# Patient Record
Sex: Female | Born: 1980 | Race: White | Hispanic: Yes | Marital: Single | Smoking: Never smoker
Health system: Southern US, Community
[De-identification: ages and names within clinical notes are randomized; demographics above are authoritative.]

## PROBLEM LIST (undated history)

## (undated) ENCOUNTER — Inpatient Hospital Stay (HOSPITAL_COMMUNITY): Payer: Self-pay

## (undated) DIAGNOSIS — Z87440 Personal history of urinary (tract) infections: Secondary | ICD-10-CM

## (undated) DIAGNOSIS — D219 Benign neoplasm of connective and other soft tissue, unspecified: Secondary | ICD-10-CM

## (undated) HISTORY — PX: CHOLECYSTECTOMY: SHX55

## (undated) HISTORY — DX: Personal history of urinary (tract) infections: Z87.440

## (undated) HISTORY — DX: Benign neoplasm of connective and other soft tissue, unspecified: D21.9

---

## 2003-07-22 ENCOUNTER — Encounter: Admission: RE | Admit: 2003-07-22 | Discharge: 2003-07-22 | Payer: Self-pay | Admitting: *Deleted

## 2003-07-29 ENCOUNTER — Encounter: Admission: RE | Admit: 2003-07-29 | Discharge: 2003-07-29 | Payer: Self-pay | Admitting: *Deleted

## 2003-08-02 ENCOUNTER — Inpatient Hospital Stay (HOSPITAL_COMMUNITY): Admission: AD | Admit: 2003-08-02 | Discharge: 2003-08-02 | Payer: Self-pay | Admitting: *Deleted

## 2003-08-03 ENCOUNTER — Inpatient Hospital Stay (HOSPITAL_COMMUNITY): Admission: AD | Admit: 2003-08-03 | Discharge: 2003-08-05 | Payer: Self-pay | Admitting: Family Medicine

## 2008-08-04 ENCOUNTER — Ambulatory Visit (HOSPITAL_COMMUNITY): Admission: RE | Admit: 2008-08-04 | Discharge: 2008-08-04 | Payer: Self-pay | Admitting: Obstetrics & Gynecology

## 2008-08-26 ENCOUNTER — Inpatient Hospital Stay (HOSPITAL_COMMUNITY): Admission: AD | Admit: 2008-08-26 | Discharge: 2008-08-28 | Payer: Self-pay | Admitting: Obstetrics & Gynecology

## 2008-08-26 ENCOUNTER — Ambulatory Visit: Payer: Self-pay | Admitting: Obstetrics & Gynecology

## 2008-09-25 ENCOUNTER — Inpatient Hospital Stay (HOSPITAL_COMMUNITY): Admission: AD | Admit: 2008-09-25 | Discharge: 2008-09-25 | Payer: Self-pay | Admitting: Obstetrics and Gynecology

## 2008-10-03 ENCOUNTER — Emergency Department (HOSPITAL_COMMUNITY): Admission: EM | Admit: 2008-10-03 | Discharge: 2008-10-03 | Payer: Self-pay | Admitting: Emergency Medicine

## 2008-10-17 ENCOUNTER — Encounter (INDEPENDENT_AMBULATORY_CARE_PROVIDER_SITE_OTHER): Payer: Self-pay | Admitting: Surgery

## 2008-10-17 ENCOUNTER — Ambulatory Visit (HOSPITAL_COMMUNITY): Admission: RE | Admit: 2008-10-17 | Discharge: 2008-10-18 | Payer: Self-pay | Admitting: Surgery

## 2010-05-14 LAB — CBC
HCT: 38.7 % (ref 36.0–46.0)
Hemoglobin: 13.1 g/dL (ref 12.0–15.0)
RBC: 4.42 MIL/uL (ref 3.87–5.11)
RDW: 13 % (ref 11.5–15.5)
WBC: 6.1 10*3/uL (ref 4.0–10.5)

## 2010-05-15 LAB — DIFFERENTIAL
Basophils Absolute: 0 K/uL (ref 0.0–0.1)
Basophils Absolute: 0 10*3/uL (ref 0.0–0.1)
Basophils Relative: 0 % (ref 0–1)
Eosinophils Absolute: 0.1 K/uL (ref 0.0–0.7)
Eosinophils Relative: 1 % (ref 0–5)
Lymphocytes Relative: 27 % (ref 12–46)
Lymphocytes Relative: 43 % (ref 12–46)
Lymphs Abs: 1.6 K/uL (ref 0.7–4.0)
Lymphs Abs: 2.9 10*3/uL (ref 0.7–4.0)
Monocytes Absolute: 0.3 K/uL (ref 0.1–1.0)
Monocytes Absolute: 0.4 10*3/uL (ref 0.1–1.0)
Monocytes Relative: 5 % (ref 3–12)
Monocytes Relative: 6 % (ref 3–12)
Neutro Abs: 4.1 K/uL (ref 1.7–7.7)
Neutro Abs: 3.3 10*3/uL (ref 1.7–7.7)
Neutrophils Relative %: 67 % (ref 43–77)

## 2010-05-15 LAB — GC/CHLAMYDIA PROBE AMP, GENITAL: Chlamydia, DNA Probe: NEGATIVE

## 2010-05-15 LAB — CBC
HCT: 36.3 % (ref 36.0–46.0)
HCT: 34.9 % — ABNORMAL LOW (ref 36.0–46.0)
Hemoglobin: 12.3 g/dL (ref 12.0–15.0)
Hemoglobin: 11.9 g/dL — ABNORMAL LOW (ref 12.0–15.0)
MCHC: 34 g/dL (ref 30.0–36.0)
MCV: 89.5 fL (ref 78.0–100.0)
MCV: 87.8 fL (ref 78.0–100.0)
Platelets: 288 K/uL (ref 150–400)
RBC: 3.98 MIL/uL (ref 3.87–5.11)
RDW: 13.2 % (ref 11.5–15.5)
RDW: 12.9 % (ref 11.5–15.5)
WBC: 6.8 K/uL (ref 4.0–10.5)

## 2010-05-15 LAB — URINALYSIS, ROUTINE W REFLEX MICROSCOPIC
Glucose, UA: NEGATIVE mg/dL
Specific Gravity, Urine: 1.01 (ref 1.005–1.030)
Urobilinogen, UA: 0.2 mg/dL (ref 0.0–1.0)
pH: 5.5 (ref 5.0–8.0)

## 2010-05-15 LAB — URINE MICROSCOPIC-ADD ON

## 2010-05-15 LAB — WET PREP, GENITAL: Trich, Wet Prep: NONE SEEN

## 2010-05-15 LAB — COMPREHENSIVE METABOLIC PANEL
Albumin: 3.8 g/dL (ref 3.5–5.2)
BUN: 14 mg/dL (ref 6–23)
Calcium: 9.2 mg/dL (ref 8.4–10.5)
Creatinine, Ser: 0.5 mg/dL (ref 0.4–1.2)
Total Protein: 6.9 g/dL (ref 6.0–8.3)

## 2010-05-15 LAB — LIPASE, BLOOD: Lipase: 49 U/L (ref 11–59)

## 2010-05-16 LAB — CBC
HCT: 28 % — ABNORMAL LOW (ref 36.0–46.0)
HCT: 29.8 % — ABNORMAL LOW (ref 36.0–46.0)
Hemoglobin: 12.9 g/dL (ref 12.0–15.0)
MCHC: 35 g/dL (ref 30.0–36.0)
MCV: 90.9 fL (ref 78.0–100.0)
MCV: 91.7 fL (ref 78.0–100.0)
Platelets: 198 10*3/uL (ref 150–400)
Platelets: 215 10*3/uL (ref 150–400)
RBC: 4.03 MIL/uL (ref 3.87–5.11)
RDW: 13.3 % (ref 11.5–15.5)
WBC: 13.1 10*3/uL — ABNORMAL HIGH (ref 4.0–10.5)

## 2010-05-16 LAB — DIFFERENTIAL
Blasts: 0 %
Metamyelocytes Relative: 0 %
Monocytes Absolute: 0.3 10*3/uL (ref 0.1–1.0)
Monocytes Relative: 3 % (ref 3–12)
Myelocytes: 0 %
Promyelocytes Absolute: 0 %
nRBC: 0 /100 WBC

## 2011-01-08 IMAGING — US US ABDOMEN COMPLETE
1 series · 14 of 25 positions shown · non-contrast
Comparison: None

CLINICAL DATA: Right upper quadrant pain.  35 weeks pregnant.

ABDOMINAL ULTRASOUND COMPLETE

[Series 1: us ob comp +14 wk · 14 of 73 slices shown]
[im 1/73]
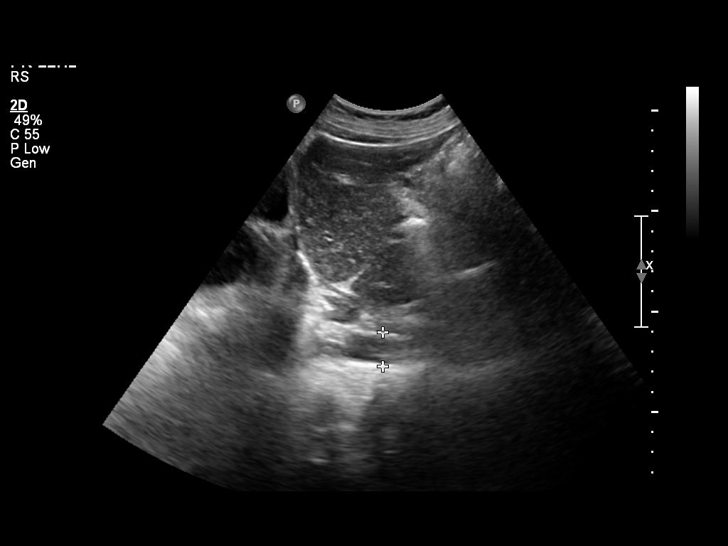
[im 7/73]
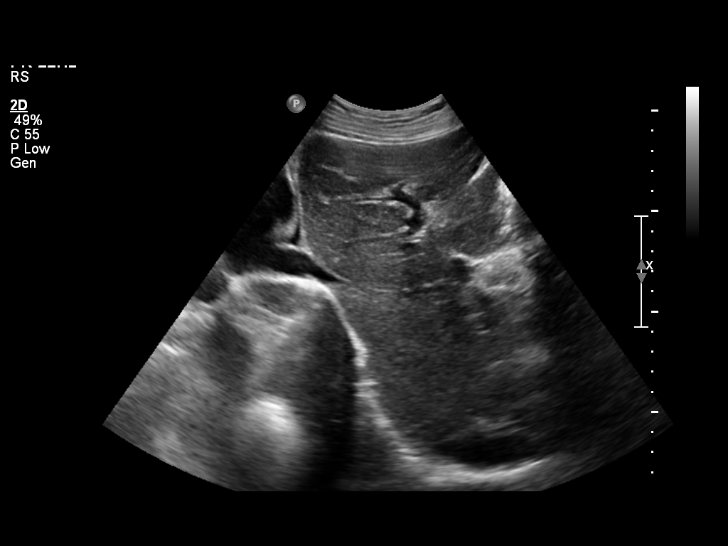
[im 13/73]
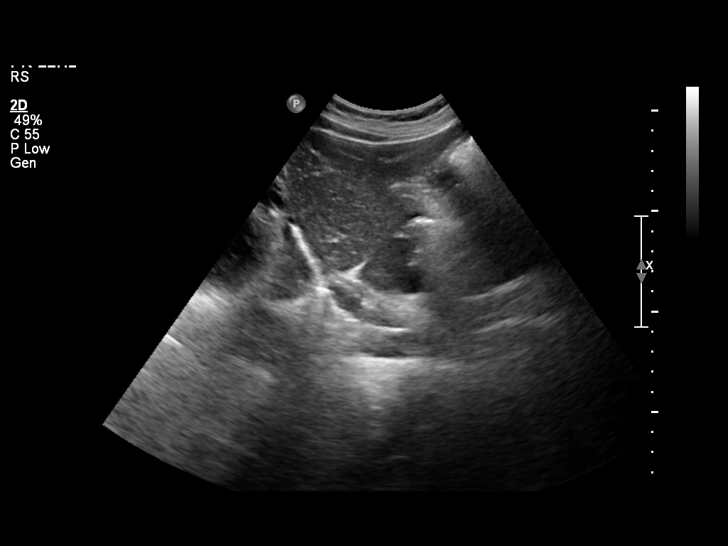
[im 19/73]
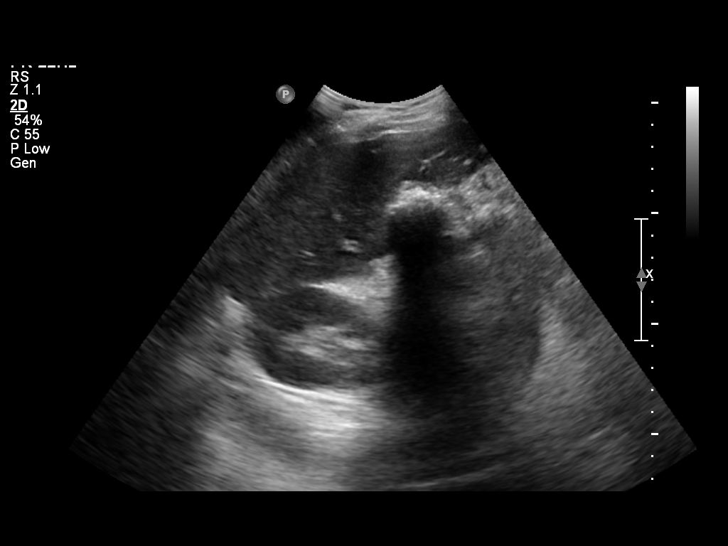
[im 25/73]
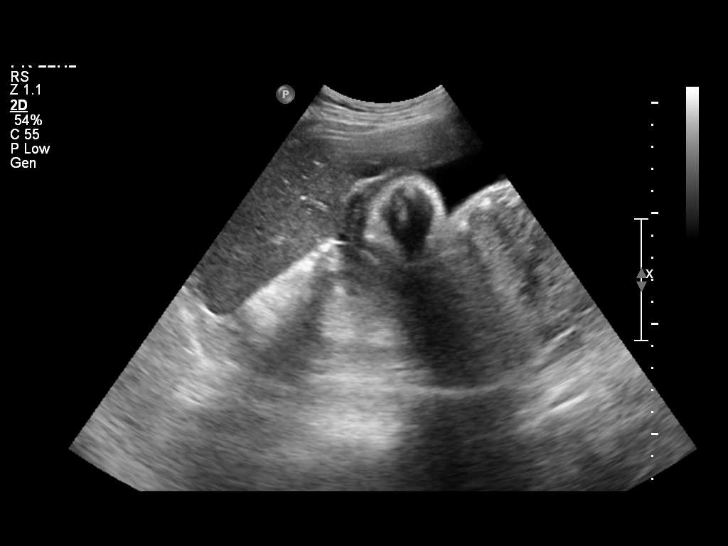
[im 28/73]
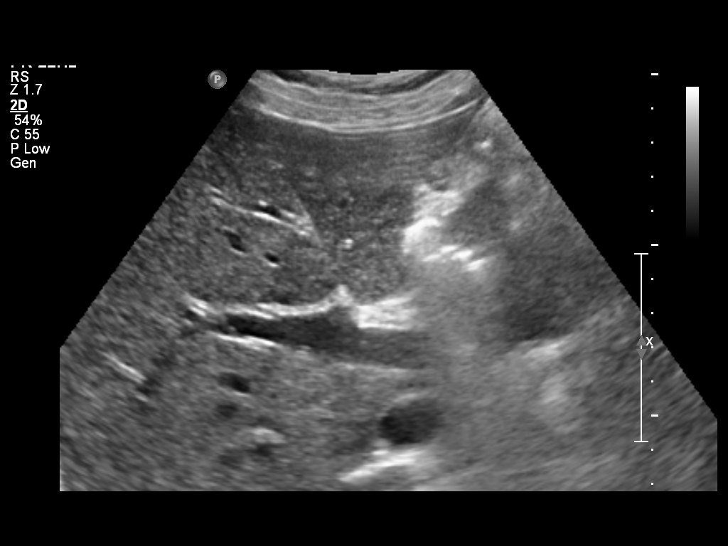
[im 34/73]
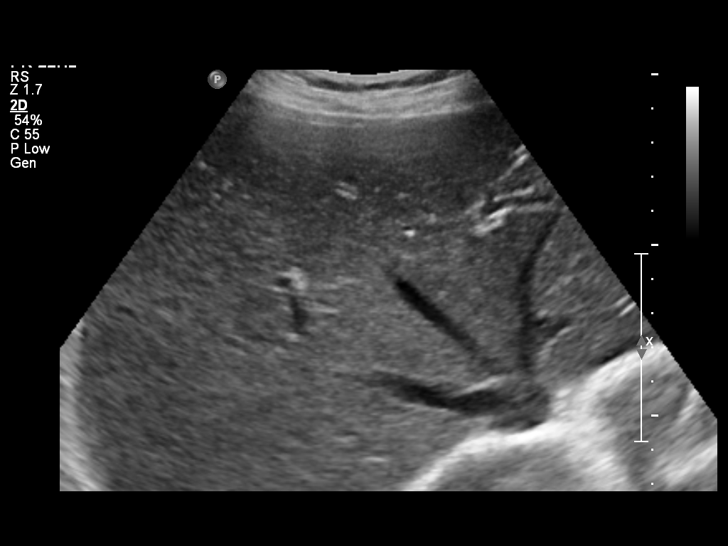
[im 40/73]
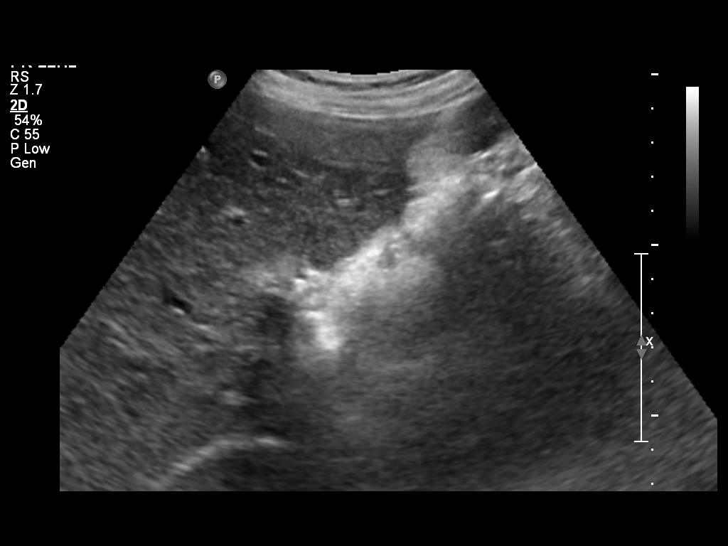
[im 46/73]
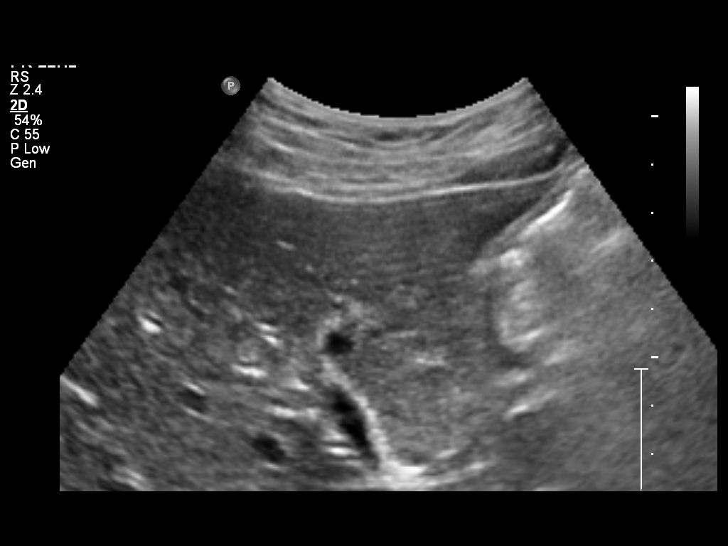
[im 49/73]
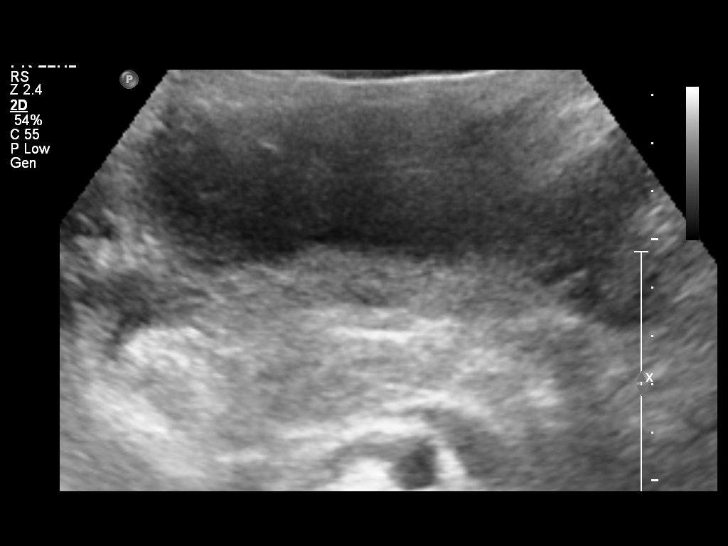
[im 55/73]
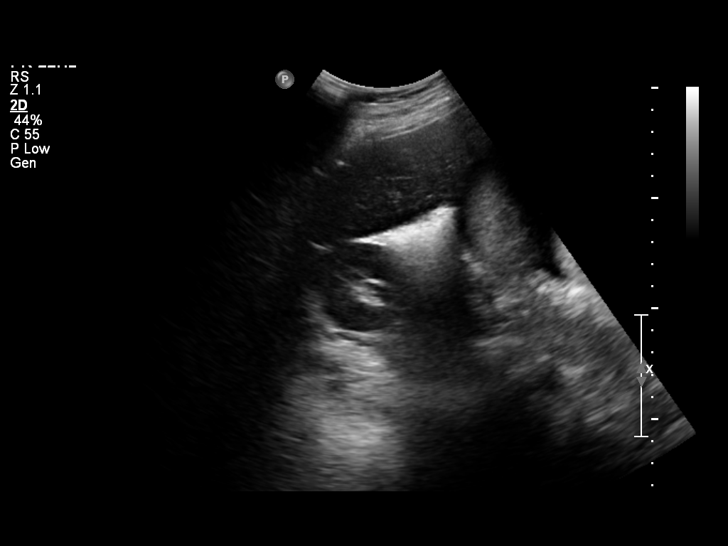
[im 61/73]
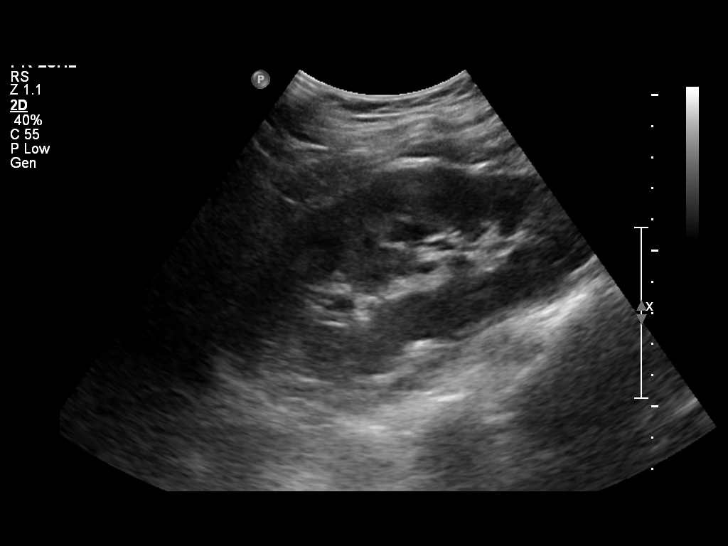
[im 67/73]
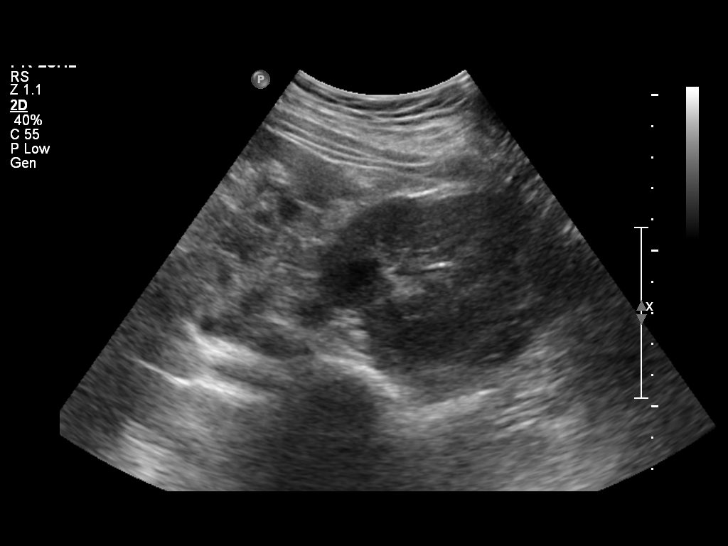
[im 73/73]
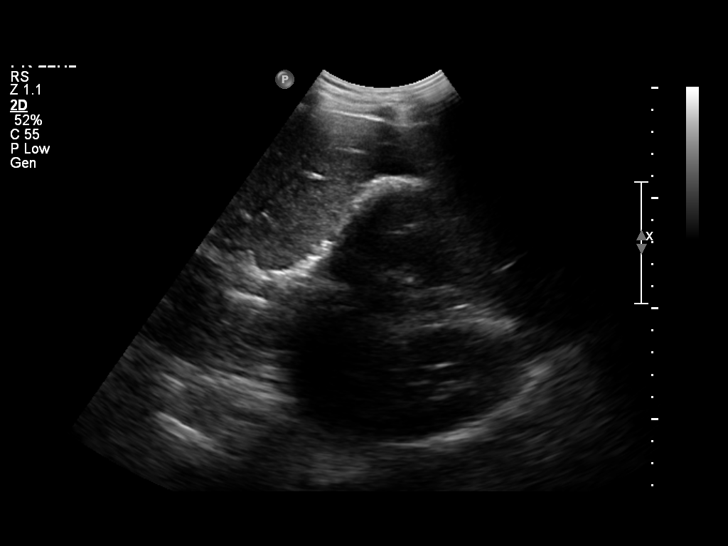

[14 of 25 positions shown; findings below may reference images not displayed]

FINDINGS: Gallbladder:  Wall echo shadow complex is seen, consistent with
gallstones filling the gallbladder lumen.  This limits the
visualization of the gallbladder, however there is no definite
evidence of gallbladder wall thickening or pericholecystic fluid.

Common Bile Duct:  Within normal limits in caliber.  Measures 2 mm
in diameter.

Liver:  No focal lesion identified.  Within normal limits in
parenchymal echogenicity.

IVC:  Appears normal.

Pancreas:  Although the pancreas is difficult to visualize in its
entirety, no focal pancreatic abnormality is identified.

Spleen:  Within normal limits in size and echotexture.

Right kidney: Normal in size and parenchymal echogenicity.  No
evidence of mass or hydronephrosis.

Left kidney: Normal in size and parenchymal echogenicity.  No
evidence of mass or hydronephrosis.

Abdominal Aorta:  No aneurysm identified.
IMPRESSION: Numerous gallstones filling the gallbladder.  No definite evidence
of acute cholecystitis or biliary dilatation.

## 2020-06-01 LAB — OB RESULTS CONSOLE VARICELLA ZOSTER ANTIBODY, IGG: Varicella: IMMUNE

## 2020-06-01 LAB — OB RESULTS CONSOLE ABO/RH: RH Type: POSITIVE

## 2020-06-01 LAB — OB RESULTS CONSOLE GC/CHLAMYDIA
Chlamydia: NEGATIVE
Gonorrhea: NEGATIVE

## 2020-06-01 LAB — OB RESULTS CONSOLE RUBELLA ANTIBODY, IGM: Rubella: IMMUNE

## 2020-06-01 LAB — OB RESULTS CONSOLE HIV ANTIBODY (ROUTINE TESTING): HIV: NONREACTIVE

## 2020-06-01 LAB — OB RESULTS CONSOLE RPR: RPR: NONREACTIVE

## 2020-06-01 LAB — HEPATITIS C ANTIBODY: HCV Ab: NEGATIVE

## 2020-06-03 ENCOUNTER — Other Ambulatory Visit: Payer: Self-pay | Admitting: Nurse Practitioner

## 2020-06-03 DIAGNOSIS — Z363 Encounter for antenatal screening for malformations: Secondary | ICD-10-CM

## 2020-06-03 DIAGNOSIS — Z3A19 19 weeks gestation of pregnancy: Secondary | ICD-10-CM

## 2020-06-03 DIAGNOSIS — O09522 Supervision of elderly multigravida, second trimester: Secondary | ICD-10-CM

## 2020-06-04 LAB — OB RESULTS CONSOLE HEPATITIS B SURFACE ANTIGEN: Hepatitis B Surface Ag: NEGATIVE

## 2020-06-11 ENCOUNTER — Encounter: Payer: Self-pay | Admitting: *Deleted

## 2020-06-17 ENCOUNTER — Encounter: Payer: Self-pay | Admitting: *Deleted

## 2020-06-17 ENCOUNTER — Other Ambulatory Visit: Payer: Self-pay | Admitting: *Deleted

## 2020-06-17 ENCOUNTER — Ambulatory Visit (HOSPITAL_BASED_OUTPATIENT_CLINIC_OR_DEPARTMENT_OTHER): Payer: Medicaid Other | Admitting: Genetic Counselor

## 2020-06-17 ENCOUNTER — Other Ambulatory Visit: Payer: Self-pay

## 2020-06-17 ENCOUNTER — Ambulatory Visit: Payer: Medicaid Other | Attending: Nurse Practitioner

## 2020-06-17 ENCOUNTER — Ambulatory Visit: Payer: Medicaid Other | Admitting: *Deleted

## 2020-06-17 VITALS — BP 122/64 | HR 63 | Ht 61.5 in

## 2020-06-17 DIAGNOSIS — O09522 Supervision of elderly multigravida, second trimester: Secondary | ICD-10-CM | POA: Insufficient documentation

## 2020-06-17 DIAGNOSIS — Z3A19 19 weeks gestation of pregnancy: Secondary | ICD-10-CM | POA: Insufficient documentation

## 2020-06-17 DIAGNOSIS — Z315 Encounter for genetic counseling: Secondary | ICD-10-CM | POA: Diagnosis present

## 2020-06-17 DIAGNOSIS — Z3689 Encounter for other specified antenatal screening: Secondary | ICD-10-CM

## 2020-06-17 DIAGNOSIS — O3662X0 Maternal care for excessive fetal growth, second trimester, not applicable or unspecified: Secondary | ICD-10-CM

## 2020-06-17 DIAGNOSIS — Z363 Encounter for antenatal screening for malformations: Secondary | ICD-10-CM | POA: Insufficient documentation

## 2020-06-17 NOTE — Progress Notes (Signed)
06/17/2020  Colleen Avila 05/25/1980 MRN: [id number] DOV: 06/17/2020  Colleen Avila presented to the [facility] for Maternal Fetal Care for a genetics consultation regarding advanced maternal age. Colleen Avila presented to her appointment alone. This session was facilitated by an in-person [place] interpreter.    Indication for genetic counseling - Advanced maternal age  Prenatal history  Colleen Avila is a [id number], 40 y.o. female. Her current pregnancy has completed [redacted]w[redacted]d (Estimated Date of Delivery: 11/06/20). Colleen Avila has a 21 year old son and an 5 year old daughter from a prior relationship. She has also had one prior miscarriage.  Colleen Avila denied exposure to environmental toxins or chemical agents. She denied the use of alcohol, tobacco or street drugs. She reported taking prenatal vitamins and allergy medications. She denied significant bleeding during the course of her pregnancy. Around two months ago, she had chills and muscle aches. She was not sure if she had a viral illness or a fever at that time. Her medical and surgical histories were noncontributory.  Family History  A three generation pedigree was drafted and reviewed. The family history is remarkable for the following:  - Colleen Avila has a paternal first cousin who had a son born with an esophageal obstruction that caused feeding difficulties. The infant required 2 or 3 surgeries in [place] and unfortunately died at 36 or 72 days of life. We discussed that 3-5% of all pregnancies in the [place] are affected by some sort of birth defect. Isolated birth defects often occur due to a combination of genetic and environmental factors that can be difficult to identify. Without knowing the etiology of her relative's esophageal obstruction, precise risk assessment is limited. We reviewed that anatomy ultrasound is performed to assess for some fetal anomalies during pregnancy. However, Ms.  Avila understands that ultrasounds cannot rule out all possible birth defects.  The remaining family histories were reviewed and found to be noncontributory for birth defects, intellectual disability, recurrent pregnancy loss, and known genetic conditions. Colleen Avila had limited information about the father of the pregnancy's family history; thus, risk assessment was limited.   The patient's ancestry is [place]. The father of the pregnancy's ancestry is [place]. Ashkenazi Jewish ancestry and consanguinity were denied. Pedigree will be scanned under Media.  Discussion  Advanced maternal age:  Colleen Avila was referred for genetic counseling for advanced maternal age, as she will be 40 years old at the time of delivery. We discussed that as a woman ages, the risk for certain chromosomal conditions, such as trisomy 67 (Down syndrome), trisomy 44, and trisomy 18 increases. These conditions often are not inherited, but instead occur due to an error in chromosomal division during the formation of sperm and egg cells in a process called nondisjunction. At her age and during the second trimester, Colleen Avila has approximately a 1 in 70 (1.4%) chance of having a child with a chromosomal aneuploidy. Her age-related risk to have a child with Down syndrome specifically is 1 in 133 (0.8%) in the second trimester. We briefly reviewed features associated with Down syndrome, trisomy 74, and trisomy 21.   Colleen Avila could not recall if screening for chromosomal aneuploidies was performed by the [facility]. Since I did not have access to any aneuploidy screening results, we reviewed noninvasive prenatal screening (NIPS) as an available testing option for chromosomal aneuploidies. Specifically, we discussed that NIPS analyzes cell free DNA originating from the placenta that is found in the maternal blood  circulation during pregnancy. This test is not diagnostic for chromosome  conditions, but can provide information regarding the presence or absence of extra DNA for chromosomes 13, 18, 21, and the sex chromosomes. Thus, it would not identify or rule out all fetal aneuploidy or all genetic conditions. The reported detection rate is 95-99% for trisomies 21, 18, 13, and sex chromosome aneuploidies. The false positive rate is reported to be less than 0.1% for any of these conditions. Colleen Avila indicated that she is interested in pursuing NIPS.    Ultrasound:  A complete ultrasound was performed today following to our visit. The ultrasound report will be sent under separate cover. There were no visualized fetal anomalies or markers suggestive of aneuploidy. However, some views were limited due to fetal position. Colleen Avila was counseled that ~50% of fetuses with Down syndrome and 90-95% of fetuses with trisomy 30 or trisomy 41 demonstrate a sign of the respective conditions on anatomy ultrasound.  Carrier screening:  Per ACOG recommendation, carrier screening for hemoglobinopathies, cystic fibrosis (CF) and spinal muscular atrophy (SMA) was discussed including information about the conditions, rationale for testing, autosomal recessive inheritance, and the option of prenatal diagnosis. Per records, Colleen Avila had a negative hemoglobin electrophoresis (results were not available for my review). This would significantly reduce her chance of being a carrier for a hemoglobinopathy and thus her chance of having an affected child. It also appears that carrier screening for CF may have been ordered; however, these results were not available for my review. I offered additional carrier screening for SMA, which Colleen Avila declined at this time. Without carrier screening to refine risk and based on ethnicity alone, Colleen Avila's chance of being a carrier for SMA is 1 in 48. She was informed that select hemoglobinopathies, CF, and SMA are included on [place] 's  newborn screen.   Diagnostic testing:  Colleen Avila was also counseled regarding diagnostic testing via amniocentesis. We discussed the technical aspects of the procedure and quoted up to a 1 in 500 (0.2%) risk for spontaneous pregnancy loss or other adverse pregnancy outcomes as a result of amniocentesis. Cultured cells from an amniocentesis sample allow for the visualization of a fetal karyotype, which can detect >99% of large chromosomal aberrations, including chromosomal aneuploidies associated with advanced maternal age. Chromosomal microarray can also be performed to identify smaller deletions or duplications of fetal chromosomal material. After careful consideration, Colleen Avila declined amniocentesis at this time. She understands that amniocentesis is available at any point after 16 weeks of pregnancy and that she may opt to undergo the procedure at a later date should she change her mind.  Plan:  Ms. Sinko had a sample drawn for [facility] NIPS today. She informed me that she was recently approved for Medicaid coverage. However, she did not yet have a Medicaid card or member ID. She agreed to contact me once she receives her card so that I may retrieve a copy of it and send it to [facility]. Individuals with Medicaid will not receive a bill for NIPS.   Results from NIPS will take approximately one week to be returned. I will call Colleen Avila once her results become available.  I counseled Colleen Avila regarding the above risks and available options.  The approximate face-to-face time with the genetic counselor was 60 minutes.  In summary:  Discussed advanced maternal age and options for follow-up testing  ~1.4% chance for fetus to have chromosomal aneuploidy  Had sample drawn for [facility] NIPS. We will follow  results  Discussed carrier screening for cystic fibrosis, spinal muscular atrophy, and hemoglobinopathies  Reportedly had negative hemoglobin electrophoresis and  had carrier screening for CF. Results not available for my review  Declined SMA carrier screening  Offered additional testing and screening  Declined amniocentesis  Reviewed family history concerns   Buelah Manis, MS, Counselling psychologist

## 2020-06-22 ENCOUNTER — Telehealth: Payer: Self-pay | Admitting: Genetic Counselor

## 2020-06-22 NOTE — Telephone Encounter (Signed)
Attempted to call Ms. Arango-Sosa with the help of a [employer] (ID# [phone]) to discuss her low-risk NIPS results. However, the number listed as her mobile number had a voicemail that was not set up so we were unable to leave a message, and the number listed as her home phone was disconnected. Will try to call mobile number again tomorrow.  Buelah Manis, MS, [facility] Genetic Counselor

## 2020-06-24 ENCOUNTER — Telehealth: Payer: Self-pay | Admitting: Genetic Counselor

## 2020-06-24 NOTE — Telephone Encounter (Signed)
Attempted to call Colleen Avila again with the help of [employer] (ID# [phone]) to discuss her low-risk noninvasive prenatal screening results. However, the call again went to voicemail and we were unable to leave a message as the patient's voicemail was not set up. Will try one more time tomorrow.  Buelah Manis, MS, [facility] Genetic Counselor

## 2020-06-25 ENCOUNTER — Telehealth: Payer: Self-pay | Admitting: Genetic Counselor

## 2020-06-25 NOTE — Telephone Encounter (Signed)
Attempted to call Ms. Arango-Sosa x2 with the help of a [employer] (ID# [phone]) to discuss her low-risk NIPS result. However, we were still unable to reach her. I will fax a copy of her test results to the [facility] Department and have a member of our team discuss her results with her at her next ultrasound on 6/6 as I will be out of the office that day.  Buelah Manis, MS, [facility] Genetic Counselor

## 2020-06-29 ENCOUNTER — Other Ambulatory Visit: Payer: Self-pay

## 2020-06-30 ENCOUNTER — Ambulatory Visit: Payer: Self-pay

## 2020-07-13 ENCOUNTER — Other Ambulatory Visit: Payer: Self-pay

## 2020-07-13 ENCOUNTER — Other Ambulatory Visit: Payer: Self-pay | Admitting: *Deleted

## 2020-07-13 ENCOUNTER — Ambulatory Visit: Payer: Medicaid Other | Admitting: *Deleted

## 2020-07-13 ENCOUNTER — Ambulatory Visit: Payer: Medicaid Other | Attending: Obstetrics

## 2020-07-13 VITALS — BP 111/52 | HR 69

## 2020-07-13 DIAGNOSIS — O09522 Supervision of elderly multigravida, second trimester: Secondary | ICD-10-CM

## 2020-07-13 DIAGNOSIS — O3662X0 Maternal care for excessive fetal growth, second trimester, not applicable or unspecified: Secondary | ICD-10-CM

## 2020-07-13 DIAGNOSIS — Z362 Encounter for other antenatal screening follow-up: Secondary | ICD-10-CM

## 2020-07-13 DIAGNOSIS — Z3A24 24 weeks gestation of pregnancy: Secondary | ICD-10-CM

## 2020-09-18 ENCOUNTER — Encounter: Payer: Self-pay | Admitting: *Deleted

## 2020-09-18 ENCOUNTER — Ambulatory Visit: Payer: Medicaid Other | Attending: Obstetrics and Gynecology

## 2020-09-18 ENCOUNTER — Ambulatory Visit: Payer: Medicaid Other | Admitting: *Deleted

## 2020-09-18 ENCOUNTER — Other Ambulatory Visit: Payer: Self-pay

## 2020-09-18 VITALS — BP 117/75 | HR 75

## 2020-09-18 DIAGNOSIS — O09522 Supervision of elderly multigravida, second trimester: Secondary | ICD-10-CM | POA: Diagnosis present

## 2020-09-18 DIAGNOSIS — O09523 Supervision of elderly multigravida, third trimester: Secondary | ICD-10-CM | POA: Insufficient documentation

## 2020-09-30 LAB — OB RESULTS CONSOLE GBS: GBS: POSITIVE

## 2020-10-25 ENCOUNTER — Encounter (HOSPITAL_COMMUNITY): Payer: Self-pay | Admitting: Obstetrics & Gynecology

## 2020-10-25 ENCOUNTER — Inpatient Hospital Stay (HOSPITAL_COMMUNITY)
Admission: AD | Admit: 2020-10-25 | Discharge: 2020-10-25 | Disposition: A | Discharge status: Home / Self Care | Payer: Medicaid Other | Attending: Obstetrics & Gynecology | Admitting: Obstetrics & Gynecology

## 2020-10-25 DIAGNOSIS — Z3A39 39 weeks gestation of pregnancy: Secondary | ICD-10-CM | POA: Diagnosis not present

## 2020-10-25 DIAGNOSIS — O26893 Other specified pregnancy related conditions, third trimester: Secondary | ICD-10-CM | POA: Insufficient documentation

## 2020-10-25 DIAGNOSIS — H905 Unspecified sensorineural hearing loss: Secondary | ICD-10-CM | POA: Diagnosis not present

## 2020-10-25 DIAGNOSIS — O99891 Other specified diseases and conditions complicating pregnancy: Secondary | ICD-10-CM

## 2020-10-25 DIAGNOSIS — R519 Headache, unspecified: Secondary | ICD-10-CM | POA: Diagnosis not present

## 2020-10-25 DIAGNOSIS — Z79899 Other long term (current) drug therapy: Secondary | ICD-10-CM | POA: Diagnosis not present

## 2020-10-25 DIAGNOSIS — H9190 Unspecified hearing loss, unspecified ear: Secondary | ICD-10-CM

## 2020-10-25 NOTE — MAU Note (Signed)
Colleen Avila is a 40 y.o. at 26w5dhere in MAU reporting: headache for the past 3 days. Since Tuesday has not been able to hear out of her left ear. Also having irregular contractions. No bleeding or LOF. +FM  Onset of complaint: ongoing  Pain score: 6/10  Vitals:   10/25/20 1208  BP: 117/69  Pulse: 85  Resp: 16  Temp: 98.3 F (36.8 C)  SpO2: 98%     FHT:+ FM, doppler deferred due to patient wearing a form fitting dress  Lab orders placed from triage: UA

## 2020-10-25 NOTE — Progress Notes (Signed)
Subjective:  Patient states that she has been having headaches off and on for the last week. Her headache has now gone away but a couple days after her headaches started she developed decreased hearing in her left ear. She denies left ear pain or popping. She denies fevers and has not recently been ill with a viral illness. She has a history of allergies for which she takes Zyrtec but she denies a recent flare in her symptoms. She states that she has felt off balance since her hearing has changed. She has not fallen. She states that she just feels unsteady and does not get the sensation of room spinning. She has never had these symptoms before. She denies a headache at this time.  Objective:  Today's Vitals   10/25/20 1204 10/25/20 1207 10/25/20 1208  BP:   117/69  Pulse:   85  Resp:   16  Temp:   98.3 F (36.8 C)  TempSrc:   Oral  SpO2:   98%  Weight: 77.9 kg    PainSc:  6     Body mass index is 31.92 kg/m.  General: Alert, interactive, no distress HEENT: EOMI, PERRL, no nystagmus, no nasal congestion, posterior oropharynx normal appearing without erythema or exudates, right TM unable to be visualized due to wax, left TM normal appearing without erythema or obvious effusion Resp: Normal work of breathing on room air  CV: Normal rate, warm and well perfused  Neuro: Cranial nerves II-XII intact, normal strength and sensation in bilateral upper and lower extremities, Dix-Hallpike negative   Assessment/Plan:  Patient presents with decreased hearing out of her left ear over the last few days. Previously was having a headache but this has largely resolved and now comes and goes. Her hearing problem is not associated with her headaches. Exam as above reassuring. Normal neurological exam. Patient is able to hear out of the left ear, though this is subjectively decreased compared to the right. No signs of infection or effusion in the left ear. No wax impaction in the left ear. Dix-Hallpike  negative - unlikely BPPV. No recent viral illness but does have history of allergies. Considered possible vestibular neuritis given associated imbalance. Ultimately recommended further evaluation by Audiology outpatient. Will continue to monitor for now. Strict return precautions reviewed and patient voiced understanding.   Vilma Meckel, MD  OB Fellow  Faculty Practice

## 2020-10-25 NOTE — MAU Provider Note (Signed)
History     CSN: [id number]  Arrival date and time: 10/25/20 1152   Event Date/Time   First Provider Initiated Contact with Patient 10/25/20 1228      Chief Complaint  Patient presents with   Headache   HPI Colleen Avila is a 40 y.o. G3P2002 at 65w5dwho presents with complaint of hearing loss. Reports decreased hearing from her left ear since Wednesday. States she doesn't think she can hear anything out of that ear. No recent illness, no swimming, no ringing in her ears. Reports feeling imbalanced since then, especially when waking up in the morning. Has had some intermittent headaches for the last week but currently denies headache. No other symptoms.   OB History     Gravida  3   Para  2   Term  2   Preterm      AB      Living  2      SAB      IAB      Ectopic      Multiple      Live Births              Past Medical History:  Diagnosis Date   Fibroid    History of recurrent UTIs     Past Surgical History:  Procedure Laterality Date   CHOLECYSTECTOMY      Family History  Problem Relation Age of Onset   Diabetes Maternal Grandmother    Hypertension Maternal Grandmother    Diabetes Maternal Grandfather    Hypertension Maternal Grandfather    Diabetes Paternal Grandmother    Hypertension Paternal Grandmother    Diabetes Paternal Grandfather    Hypertension Paternal Grandfather     Social History   Tobacco Use   Smoking status: Never   Smokeless tobacco: Never  Vaping Use   Vaping Use: Never used  Substance Use Topics   Alcohol use: Never   Drug use: Never    Allergies: No Known Allergies  Medications Prior to Admission  Medication Sig Dispense Refill Last Dose   Prenatal Vit-Fe Fumarate-FA (PRENATAL MULTIVITAMIN) TABS tablet Take 1 tablet by mouth daily at 12 noon.   10/24/2020   cetirizine (ZYRTEC) 10 MG tablet Take 10 mg by mouth daily. (Patient not taking: Reported on 07/13/2020)       Review of Systems  Constitutional:  Negative.   HENT:  Positive for hearing loss. Negative for congestion, ear pain and tinnitus.   Gastrointestinal: Negative.   Neurological:  Positive for dizziness and headaches.  Physical Exam   Blood pressure 126/73, pulse 82, temperature 98.3 F (36.8 C), temperature source Oral, resp. rate 15, weight 77.9 kg, last menstrual period 01/31/2020, SpO2 98 %.  Physical Exam Vitals and nursing note reviewed.  Constitutional:      General: She is not in acute distress.    Appearance: She is well-developed.  HENT:     Head: Normocephalic and atraumatic.     Right Ear: Tympanic membrane and ear canal normal. There is no impacted cerumen. Tympanic membrane is not scarred or erythematous.     Left Ear: Tympanic membrane and ear canal normal. There is no impacted cerumen. Tympanic membrane is not scarred or erythematous.  Eyes:     General: No scleral icterus. Pulmonary:     Effort: Pulmonary effort is normal. No respiratory distress.  Skin:    General: Skin is warm and dry.  Neurological:     General: No focal deficit present.  Mental Status: She is alert.     Cranial Nerves: No facial asymmetry.  Psychiatric:        Mood and Affect: Mood normal.        Behavior: Behavior normal.   Dilation: 1 Effacement (%): Thick Station: Ballotable Presentation: Undeterminable Exam by:: Holly Flippin RN  NST:  Baseline: 140 bpm, Variability: Good {> 6 bpm), Accelerations: Reactive, and Decelerations: Absent  MAU Course  Procedures  MDM Reports some occasional contractions - no regular ctx per toco & cervix 1/thick/ballotable  Has had some intermittent headache but currently no headache. She is normotensive.   Normal ear exam with otoscope. Evaluated by Dr. Gwenlyn Perking as well. Normal neuro exam. No emergent condition at this time. Will reassess symptoms when patient comes back to have her baby - if no improvement, she may need an outpatient audiology referral.  Assessment and Plan   1.  Perceived hearing loss   2. [redacted] weeks gestation of pregnancy    -may need outpatient audiology referral if symptoms don't improve  Jorje Guild 10/25/2020, 12:28 PM

## 2020-10-29 ENCOUNTER — Other Ambulatory Visit: Payer: Self-pay | Admitting: Student

## 2020-10-30 ENCOUNTER — Telehealth (HOSPITAL_COMMUNITY): Payer: Self-pay | Admitting: *Deleted

## 2020-10-30 NOTE — Telephone Encounter (Signed)
Preadmission screen Interpreter number [phone]

## 2020-11-03 ENCOUNTER — Inpatient Hospital Stay (HOSPITAL_COMMUNITY): Payer: Medicaid Other

## 2020-11-03 ENCOUNTER — Other Ambulatory Visit: Payer: Self-pay

## 2020-11-03 ENCOUNTER — Encounter (HOSPITAL_COMMUNITY): Payer: Self-pay | Admitting: Family Medicine

## 2020-11-03 ENCOUNTER — Inpatient Hospital Stay (HOSPITAL_COMMUNITY)
Admission: AD | Admit: 2020-11-03 | Discharge: 2020-11-07 | DRG: 787 | Disposition: A | Discharge status: Home / Self Care | Payer: Medicaid Other | Attending: Obstetrics and Gynecology | Admitting: Obstetrics and Gynecology

## 2020-11-03 DIAGNOSIS — O99820 Streptococcus B carrier state complicating pregnancy: Secondary | ICD-10-CM | POA: Diagnosis not present

## 2020-11-03 DIAGNOSIS — D62 Acute posthemorrhagic anemia: Secondary | ICD-10-CM | POA: Diagnosis not present

## 2020-11-03 DIAGNOSIS — O480 Post-term pregnancy: Principal | ICD-10-CM | POA: Diagnosis present

## 2020-11-03 DIAGNOSIS — H81392 Other peripheral vertigo, left ear: Secondary | ICD-10-CM | POA: Diagnosis not present

## 2020-11-03 DIAGNOSIS — Z20822 Contact with and (suspected) exposure to covid-19: Secondary | ICD-10-CM | POA: Diagnosis present

## 2020-11-03 DIAGNOSIS — O9081 Anemia of the puerperium: Secondary | ICD-10-CM | POA: Diagnosis not present

## 2020-11-03 DIAGNOSIS — O99824 Streptococcus B carrier state complicating childbirth: Secondary | ICD-10-CM | POA: Diagnosis present

## 2020-11-03 DIAGNOSIS — O99893 Other specified diseases and conditions complicating puerperium: Secondary | ICD-10-CM | POA: Diagnosis not present

## 2020-11-03 DIAGNOSIS — Z3A41 41 weeks gestation of pregnancy: Secondary | ICD-10-CM

## 2020-11-03 DIAGNOSIS — H9192 Unspecified hearing loss, left ear: Secondary | ICD-10-CM

## 2020-11-03 LAB — CBC
HCT: 36.9 % (ref 36.0–46.0)
Hemoglobin: 13 g/dL (ref 12.0–15.0)
MCH: 30.5 pg (ref 26.0–34.0)
MCHC: 35.2 g/dL (ref 30.0–36.0)
MCV: 86.6 fL (ref 80.0–100.0)
Platelets: 264 10*3/uL (ref 150–400)
RBC: 4.26 MIL/uL (ref 3.87–5.11)
RDW: 13.4 % (ref 11.5–15.5)
WBC: 6.7 10*3/uL (ref 4.0–10.5)
nRBC: 0 % (ref 0.0–0.2)

## 2020-11-03 LAB — TYPE AND SCREEN
ABO/RH(D): O POS
Antibody Screen: NEGATIVE

## 2020-11-03 LAB — SARS CORONAVIRUS 2 (TAT 6-24 HRS): SARS Coronavirus 2: NEGATIVE

## 2020-11-03 MED ORDER — EPHEDRINE 5 MG/ML INJ
10.0000 mg | INTRAVENOUS | Status: DC | PRN
  Administered 2020-11-04: 10 mg via INTRAVENOUS
  Filled 2020-11-03: qty 5

## 2020-11-03 MED ORDER — OXYTOCIN-SODIUM CHLORIDE 30-0.9 UT/500ML-% IV SOLN: 2.5000 [IU]/h | INTRAVENOUS | Status: DC

## 2020-11-03 MED ORDER — PENICILLIN G POT IN DEXTROSE 60000 UNIT/ML IV SOLN
3.0000 10*6.[IU] | INTRAVENOUS | Status: DC
  Administered 2020-11-03 – 2020-11-04 (×3): 3 10*6.[IU] via INTRAVENOUS

## 2020-11-03 MED ORDER — PHENYLEPHRINE 40 MCG/ML (10ML) SYRINGE FOR IV PUSH (FOR BLOOD PRESSURE SUPPORT)
80.0000 ug | PREFILLED_SYRINGE | INTRAVENOUS | Status: AC | PRN
Start: 2020-11-03 — End: 2020-11-04
  Administered 2020-11-04 (×3): 80 ug via INTRAVENOUS

## 2020-11-03 MED ORDER — OXYCODONE-ACETAMINOPHEN 5-325 MG PO TABS: 2.0000 | ORAL_TABLET | ORAL | Status: DC | PRN

## 2020-11-03 MED ORDER — ACETAMINOPHEN 325 MG PO TABS
650.0000 mg | ORAL_TABLET | ORAL | Status: DC | PRN
Start: 2020-11-03 — End: 2020-11-04

## 2020-11-03 MED ORDER — LACTATED RINGERS IV SOLN
500.0000 mL | Freq: Once | INTRAVENOUS | Status: AC
  Administered 2020-11-03: 500 mL via INTRAVENOUS

## 2020-11-03 MED ORDER — OXYTOCIN-SODIUM CHLORIDE 30-0.9 UT/500ML-% IV SOLN
1.0000 m[IU]/min | INTRAVENOUS | Status: DC
  Administered 2020-11-03: 2 m[IU]/min via INTRAVENOUS
  Filled 2020-11-03: qty 500

## 2020-11-03 MED ORDER — TERBUTALINE SULFATE 1 MG/ML IJ SOLN: 0.2500 mg | Freq: Once | INTRAMUSCULAR | Status: DC | PRN

## 2020-11-03 MED ORDER — PHENYLEPHRINE 40 MCG/ML (10ML) SYRINGE FOR IV PUSH (FOR BLOOD PRESSURE SUPPORT)
80.0000 ug | PREFILLED_SYRINGE | INTRAVENOUS | Status: AC | PRN
  Administered 2020-11-04 (×2): 80 ug via INTRAVENOUS
  Administered 2020-11-04: 120 ug via INTRAVENOUS
  Filled 2020-11-03: qty 10

## 2020-11-03 MED ORDER — LACTATED RINGERS IV SOLN
500.0000 mL | INTRAVENOUS | Status: DC | PRN
  Administered 2020-11-04 (×2): 500 mL via INTRAVENOUS

## 2020-11-03 MED ORDER — ONDANSETRON HCL 4 MG/2ML IJ SOLN: 4.0000 mg | Freq: Four times a day (QID) | INTRAMUSCULAR | Status: DC | PRN

## 2020-11-03 MED ORDER — SODIUM CHLORIDE 0.9 % IV SOLN: 5.0000 10*6.[IU] | Freq: Once | INTRAVENOUS | Status: DC

## 2020-11-03 MED ORDER — LACTATED RINGERS IV SOLN: INTRAVENOUS | Status: DC

## 2020-11-03 MED ORDER — LIDOCAINE HCL (PF) 1 % IJ SOLN: 30.0000 mL | INTRAMUSCULAR | Status: DC | PRN

## 2020-11-03 MED ORDER — PENICILLIN G POT IN DEXTROSE 60000 UNIT/ML IV SOLN
3.0000 10*6.[IU] | INTRAVENOUS | Status: DC
Start: 2020-11-03 — End: 2020-11-04
  Filled 2020-11-03 (×4): qty 50

## 2020-11-03 MED ORDER — EPHEDRINE 5 MG/ML INJ
10.0000 mg | INTRAVENOUS | Status: AC | PRN
  Administered 2020-11-04 (×2): 10 mg via INTRAVENOUS

## 2020-11-03 MED ORDER — OXYTOCIN BOLUS FROM INFUSION: 333.0000 mL | Freq: Once | INTRAVENOUS | Status: DC

## 2020-11-03 MED ORDER — MISOPROSTOL 50MCG HALF TABLET
50.0000 ug | ORAL_TABLET | ORAL | Status: DC | PRN
  Administered 2020-11-03: 50 ug via ORAL
  Filled 2020-11-03: qty 1

## 2020-11-03 MED ORDER — SOD CITRATE-CITRIC ACID 500-334 MG/5ML PO SOLN
30.0000 mL | ORAL | Status: DC | PRN
  Administered 2020-11-04: 30 mL via ORAL
  Filled 2020-11-03: qty 30

## 2020-11-03 MED ORDER — DIPHENHYDRAMINE HCL 50 MG/ML IJ SOLN
12.5000 mg | INTRAMUSCULAR | Status: DC | PRN
Start: 2020-11-03 — End: 2020-11-04

## 2020-11-03 MED ORDER — OXYCODONE-ACETAMINOPHEN 5-325 MG PO TABS
1.0000 | ORAL_TABLET | ORAL | Status: DC | PRN
Start: 2020-11-03 — End: 2020-11-04

## 2020-11-03 MED ORDER — SODIUM CHLORIDE 0.9 % IV SOLN
5.0000 10*6.[IU] | Freq: Once | INTRAVENOUS | Status: AC
  Administered 2020-11-03: 5 10*6.[IU] via INTRAVENOUS
  Filled 2020-11-03: qty 5

## 2020-11-03 MED ORDER — FENTANYL-BUPIVACAINE-NACL 0.5-0.125-0.9 MG/250ML-% EP SOLN
12.0000 mL/h | EPIDURAL | Status: DC | PRN
  Filled 2020-11-03: qty 250

## 2020-11-03 MED ORDER — FENTANYL CITRATE (PF) 100 MCG/2ML IJ SOLN
50.0000 ug | INTRAMUSCULAR | Status: DC | PRN
  Administered 2020-11-03 (×2): 100 ug via INTRAVENOUS
  Filled 2020-11-03 (×2): qty 2

## 2020-11-03 NOTE — H&P (Addendum)
OBSTETRIC ADMISSION HISTORY AND PHYSICAL  Colleen Avila is a 40 y.o. female G3P2002 with IUP at [redacted]w[redacted]d by U/S presenting for IOL. She reports +FMs, No LOF, no VB, no blurry vision, headaches or peripheral edema, and RUQ pain.  She plans on breast feeding. She will follow up at her regular clinic for birth control and is undecided for method at this time. She received her prenatal care at [place]: By U/S (06/17/2020) --->  Estimated Date of Delivery: 10/27/20  Sono:   @[redacted]w[redacted]d , CWD, normal anatomy, cephalic presentation, 6568L, 54% EFW   Prenatal History/Complications:  GBS positive  Past Medical History: Past Medical History:  Diagnosis Date   Fibroid    History of recurrent UTIs     Past Surgical History: Past Surgical History:  Procedure Laterality Date   CHOLECYSTECTOMY      Obstetrical History: OB History     Gravida  3   Para  2   Term  2   Preterm      AB      Living  2      SAB      IAB      Ectopic      Multiple      Live Births              Social History Social History   Socioeconomic History   Marital status: Single    Spouse name: Not on file   Number of children: Not on file   Years of education: Not on file   Highest education level: Not on file  Occupational History   Not on file  Tobacco Use   Smoking status: Never   Smokeless tobacco: Never  Vaping Use   Vaping Use: Never used  Substance and Sexual Activity   Alcohol use: Never   Drug use: Never   Sexual activity: Not Currently  Other Topics Concern   Not on file  Social History Narrative   Not on file   Social Determinants of Health   Financial Resource Strain: Not on file  Food Insecurity: Not on file  Transportation Needs: Not on file  Physical Activity: Not on file  Stress: Not on file  Social Connections: Not on file    Family History: Family History  Problem Relation Age of Onset   Diabetes Maternal Grandmother    Hypertension Maternal  Grandmother    Diabetes Maternal Grandfather    Hypertension Maternal Grandfather    Diabetes Paternal Grandmother    Hypertension Paternal Grandmother    Diabetes Paternal Grandfather    Hypertension Paternal Grandfather     Allergies: No Known Allergies  Medications Prior to Admission  Medication Sig Dispense Refill Last Dose   Prenatal Vit-Fe Fumarate-FA (PRENATAL MULTIVITAMIN) TABS tablet Take 1 tablet by mouth daily at 12 noon.   11/03/2020     Review of Systems   All systems reviewed and negative except as stated in HPI  Blood pressure 105/73, pulse 74, temperature 98.3 F (36.8 C), temperature source Oral, resp. rate 16, last menstrual period 01/31/2020. General appearance: alert and no distress Lungs: clear to auscultation bilaterally Heart: regular rate and rhythm Abdomen: soft, non-tender; bowel sounds normal, EFW by Leopold's by ~3700 Extremities: Homans sign is negative, no sign of DVT Presentation: cephalic Fetal monitoringBaseline: 125 bpm, Variability: Good {> 6 bpm), Accelerations: Absent, and Decelerations: Absent Uterine activityFrequency: Irregular Dilation: 1 Effacement (%): 20 Station: -3 Exam by:: Dr. Cy Avila   Prenatal labs: ABO, Rh:  O Positive (06/01/2020) Antibody: Negative (06/01/2020) Rubella: Immune (06/01/2020) RPR: Nonreactive (06/01/2020)  HBsAg: Nonreactive (06/04/2020) HIV: Nonreactive (06/01/2020) GBS: Positive (09/30/2020) 1 hr Glucola 111, (06/01/2020) Genetic screening  Normal Anatomy US Normal  Prenatal Transfer Tool  Maternal Diabetes: No Genetic Screening: Normal Maternal Ultrasounds/Referrals: Normal Fetal Ultrasounds or other Referrals:  Referred to Materal Fetal Medicine  Maternal Substance Abuse:  No Significant Maternal Medications:  None Significant Maternal Lab Results: Group B Strep positive  Results for orders placed or performed during the hospital encounter of 11/03/20 (from the past 24 hour(s))  Type and screen    Collection Time: 11/03/20 12:03 PM  Result Value Ref Range   ABO/RH(D) PENDING    Antibody Screen PENDING    Sample Expiration      11/06/2020,2359 Performed at [facility], [place] [address] [place], [place] [address]   CBC   Collection Time: 11/03/20 12:09 PM  Result Value Ref Range   WBC 6.7 4.0 - 10.5 K/uL   RBC 4.26 3.87 - 5.11 MIL/uL   Hemoglobin 13.0 12.0 - 15.0 g/dL   HCT 36.9 36.0 - 46.0 %   MCV 86.6 80.0 - 100.0 fL   MCH 30.5 26.0 - 34.0 pg   MCHC 35.2 30.0 - 36.0 g/dL   RDW 13.4 11.5 - 15.5 %   Platelets 264 150 - 400 K/uL   nRBC 0.0 0.0 - 0.2 %    Patient Active Problem List   Diagnosis Date Noted   Indication for care in labor and delivery, antepartum 11/03/2020    Assessment/Plan:  Colleen Avila is a 40 y.o. G3P2002 at [redacted]w[redacted]d here for IOL for post dates.   #Labor: Cervical check 1cm dilated, Placed Cooks balloon with 60cc in uterine balloon, and will give Cytotec #Pain: Planning for IV meds #FWB: Cat 1 #ID:  GBS+, receiving Penicillin  #MOF: Breastfeeding #MOC: undecided, plans to follow up at [facility] #Circ:  [place], [place]  11/03/2020, 1:50 PM   GME ATTESTATION:  I saw and evaluated the patient. I agree with the findings and the plan of care as documented in the student's note. I was present for history and performed the cervical check and placed the cooks balloon.   Colleen Matter, MD, MPH OB Fellow, [place] [facility] 11/03/2020 2:46 PM

## 2020-11-03 NOTE — Progress Notes (Signed)
Labor Progress Note Colleen Avila is a 40 y.o. G3P2002 at [redacted]w[redacted]d presented for IOL due to post-dates   S: She is overall doing well. Feeling her contractions, but managing through well. Spanish interpreter used.   O:  BP 120/75   Pulse 67   Temp 98.5 F (36.9 C) (Oral)   Resp 14   Ht 5' 1.5" (1.562 m)   Wt 78.4 kg   LMP 01/31/2020   BMI 32.12 kg/m  EFM: 130/mod/15x15/no decels   CVE: Dilation: 4 Effacement (%): 50 Station: Ballotable Presentation: Vertex Exam by:: Lattie Haw CNM   A&P: 40 y.o. [id number] [redacted]w[redacted]d  #Labor: Progressing well s/p FB/cytotec. Started pit about 1 hour ago, contracting about every 3 min. Cont titrating pit. Last check ballotable, may potentially be able to AROM on next check pending progression.  #Pain: Prn, planning on eventual epidural  #FWB: Cat 1  #GBS positive PCN   Patriciaann Clan, DO 9:25 PM

## 2020-11-03 NOTE — Progress Notes (Signed)
Limited OB US  Date: 11/03/20 Fetal position: Vertex noted with cranial features identified in pelvis on today's [place]  Pt informed that the ultrasound is considered a limited OB ultrasound and is not intended to be a complete ultrasound exam.  Patient also informed that the ultrasound is not being completed with the intent of assessing for fetal or placental anomalies or any pelvic abnormalities.  Explained that the purpose of today's ultrasound is to assess for  presentation.  Patient acknowledges the purpose of the exam and the limitations of the study.

## 2020-11-03 NOTE — Progress Notes (Addendum)
Colleen Avila is a 40 y.o. G3P2002 at [redacted]w[redacted]d by ultrasound admitted for IOL for post dates.  Subjective: Pt reports feeling more pressure. She is doing well walking and sitting on exercise ball.   Objective: BP 115/74   Pulse 77   Temp 98.5 F (36.9 C) (Oral)   Resp 16   Ht 5' 1.5" (1.562 m)   Wt 78.4 kg   LMP 01/31/2020   BMI 32.12 kg/m  No intake/output data recorded. No intake/output data recorded.  FHT:  FHR: 130 bpm, variability: moderate,  accelerations:  Present,  decelerations:  Absent UC:   irregular, every 4-6 minutes SVE:  Dilation: 4 Effacement (%): 50 Station: -3  Exam by: Cletus Gash, RN   Labs: Lab Results  Component Value Date   WBC 6.7 11/03/2020   HGB 13.0 11/03/2020   HCT 36.9 11/03/2020   MCV 86.6 11/03/2020   PLT 264 11/03/2020    Assessment / Plan: IOL for post dates, Cytotec given @1400 , Cooks out @ 1610, pt will eat then we will start pitocin   Labor:  Progressing well, Cytotec given at 1400, Cooks out and progressed to 4cm. Planning pitocin once patient is done eating  Fetal Wellbeing:  Category I  Pain Control:   Planning for epidural once more painful  I/D:   GBS positive, receiving PCN  Anticipated MOD:  NSVD  Real Cons 11/03/2020, 6:24 PM  GME ATTESTATION:  I saw and evaluated the patient. I agree with the findings and the plan of care as documented in the student's note.   Renard Matter, MD, MPH OB Fellow, [place] [facility] [place] 11/03/2020 7:18 PM

## 2020-11-03 NOTE — Progress Notes (Signed)
Labor Progress Note Colleen Avila is a 40 y.o. G3P2002 at [redacted]w[redacted]d presented for IOL due to post-dates   S: Feeling contractions more frequent and stronger. Having to breathe through them, uncomfortable.   O:  BP 95/67   Pulse 72   Temp 98.5 F (36.9 C) (Oral)   Resp 14   Ht 5' 1.5" (1.562 m)   Wt 78.4 kg   LMP 01/31/2020   BMI 32.12 kg/m  EFM: 135/mod/15x15/no decels   CVE: Dilation: 6 Effacement (%): 70 Station: -3 Presentation: Vertex Exam by:: Dr. Higinio Plan   A&P: 40 y.o. [id number] [redacted]w[redacted]d  #Labor: Progressing well and appears uncomfortable with contractions about every 2 minutes on 6 of pit. -3/ballotable with BOW present, did not proceed with AROM on this check. Cont with pit.  #Pain: IV fent PRN, epidural when ready  #FWB: Cat 1 #GBS positive PCN    Patriciaann Clan, DO 11:53 PM

## 2020-11-04 ENCOUNTER — Encounter (HOSPITAL_COMMUNITY): Payer: Self-pay | Admitting: Student

## 2020-11-04 ENCOUNTER — Inpatient Hospital Stay (HOSPITAL_COMMUNITY): Payer: Medicaid Other | Admitting: Anesthesiology

## 2020-11-04 ENCOUNTER — Encounter (HOSPITAL_COMMUNITY)
Admission: AD | Disposition: A | Discharge status: Home / Self Care | Payer: Self-pay | Source: Home / Self Care | Attending: Obstetrics and Gynecology

## 2020-11-04 DIAGNOSIS — O480 Post-term pregnancy: Secondary | ICD-10-CM

## 2020-11-04 DIAGNOSIS — O99820 Streptococcus B carrier state complicating pregnancy: Secondary | ICD-10-CM

## 2020-11-04 DIAGNOSIS — Z3A41 41 weeks gestation of pregnancy: Secondary | ICD-10-CM

## 2020-11-04 LAB — HEMOGLOBIN AND HEMATOCRIT, BLOOD
HCT: 26.4 % — ABNORMAL LOW (ref 36.0–46.0)
Hemoglobin: 9.2 g/dL — ABNORMAL LOW (ref 12.0–15.0)

## 2020-11-04 LAB — CREATININE, SERUM
Creatinine, Ser: 0.52 mg/dL (ref 0.44–1.00)
GFR, Estimated: >60 mL/min (ref >60)

## 2020-11-04 LAB — RPR: RPR Ser Ql: NONREACTIVE

## 2020-11-04 SURGERY — Surgical Case
Anesthesia: Epidural

## 2020-11-04 MED ORDER — MORPHINE SULFATE (PF) 0.5 MG/ML IJ SOLN
INTRAMUSCULAR | Status: AC
  Filled 2020-11-04: qty 10

## 2020-11-04 MED ORDER — MORPHINE SULFATE (PF) 0.5 MG/ML IJ SOLN
INTRAMUSCULAR | Status: DC | PRN
  Administered 2020-11-04: 3 mg via EPIDURAL

## 2020-11-04 MED ORDER — TETANUS-DIPHTH-ACELL PERTUSSIS 5-2.5-18.5 LF-MCG/0.5 IM SUSY: 0.5000 mL | PREFILLED_SYRINGE | Freq: Once | INTRAMUSCULAR | Status: DC

## 2020-11-04 MED ORDER — OXYTOCIN-SODIUM CHLORIDE 30-0.9 UT/500ML-% IV SOLN
INTRAVENOUS | Status: DC | PRN
  Administered 2020-11-04: 300 mL via INTRAVENOUS

## 2020-11-04 MED ORDER — PRENATAL MULTIVITAMIN CH
1.0000 | ORAL_TABLET | Freq: Every day | ORAL | Status: DC
  Administered 2020-11-05 – 2020-11-07 (×3): 1 via ORAL
  Filled 2020-11-04 (×3): qty 1

## 2020-11-04 MED ORDER — DIPHENHYDRAMINE HCL 50 MG/ML IJ SOLN: 12.5000 mg | INTRAMUSCULAR | Status: DC | PRN

## 2020-11-04 MED ORDER — SODIUM CHLORIDE 0.9 % IV BOLUS
500.0000 mL | Freq: Once | INTRAVENOUS | Status: AC
  Administered 2020-11-04: 500 mL via INTRAVENOUS

## 2020-11-04 MED ORDER — LACTATED RINGERS AMNIOINFUSION: INTRAVENOUS | Status: DC

## 2020-11-04 MED ORDER — ONDANSETRON HCL 4 MG/2ML IJ SOLN: 4.0000 mg | Freq: Three times a day (TID) | INTRAMUSCULAR | Status: DC | PRN

## 2020-11-04 MED ORDER — ALBUMIN HUMAN 5 % IV SOLN
INTRAVENOUS | Status: AC
  Filled 2020-11-04: qty 250

## 2020-11-04 MED ORDER — SODIUM CHLORIDE 0.9 % IR SOLN
Status: DC | PRN
  Administered 2020-11-04: 1

## 2020-11-04 MED ORDER — NALBUPHINE HCL 10 MG/ML IJ SOLN: 5.0000 mg | Freq: Once | INTRAMUSCULAR | Status: DC | PRN

## 2020-11-04 MED ORDER — ONDANSETRON HCL 4 MG/2ML IJ SOLN
INTRAMUSCULAR | Status: DC | PRN
Start: 2020-11-04 — End: 2020-11-04
  Administered 2020-11-04: 4 mg via INTRAVENOUS

## 2020-11-04 MED ORDER — MENTHOL 3 MG MT LOZG: 1.0000 | LOZENGE | OROMUCOSAL | Status: DC | PRN

## 2020-11-04 MED ORDER — SENNOSIDES-DOCUSATE SODIUM 8.6-50 MG PO TABS
2.0000 | ORAL_TABLET | Freq: Every day | ORAL | Status: DC
  Administered 2020-11-05 – 2020-11-07 (×3): 2 via ORAL
  Filled 2020-11-04 (×3): qty 2

## 2020-11-04 MED ORDER — POVIDONE-IODINE 10 % EX SWAB: 2.0000 "application " | Freq: Once | CUTANEOUS | Status: DC

## 2020-11-04 MED ORDER — WITCH HAZEL-GLYCERIN EX PADS: 1.0000 "application " | MEDICATED_PAD | CUTANEOUS | Status: DC | PRN

## 2020-11-04 MED ORDER — COCONUT OIL OIL
1.0000 "application " | TOPICAL_OIL | Status: DC | PRN
  Administered 2020-11-06: 1 via TOPICAL

## 2020-11-04 MED ORDER — FENTANYL-BUPIVACAINE-NACL 0.5-0.125-0.9 MG/250ML-% EP SOLN
EPIDURAL | Status: DC | PRN
  Administered 2020-11-04: 12 mL/h via EPIDURAL

## 2020-11-04 MED ORDER — SODIUM CHLORIDE 0.9 % IV SOLN
INTRAVENOUS | Status: DC | PRN
  Administered 2020-11-04: 500 mg via INTRAVENOUS

## 2020-11-04 MED ORDER — FENTANYL CITRATE (PF) 100 MCG/2ML IJ SOLN
INTRAMUSCULAR | Status: AC
  Filled 2020-11-04: qty 2

## 2020-11-04 MED ORDER — NALOXONE HCL 4 MG/10ML IJ SOLN
1.0000 ug/kg/h | INTRAVENOUS | Status: DC | PRN
  Filled 2020-11-04: qty 5

## 2020-11-04 MED ORDER — DIBUCAINE (PERIANAL) 1 % EX OINT: 1.0000 "application " | TOPICAL_OINTMENT | CUTANEOUS | Status: DC | PRN

## 2020-11-04 MED ORDER — SCOPOLAMINE 1 MG/3DAYS TD PT72: 1.0000 | MEDICATED_PATCH | Freq: Once | TRANSDERMAL | Status: DC

## 2020-11-04 MED ORDER — OXYTOCIN-SODIUM CHLORIDE 30-0.9 UT/500ML-% IV SOLN: INTRAVENOUS | Status: DC | PRN

## 2020-11-04 MED ORDER — KETOROLAC TROMETHAMINE 30 MG/ML IJ SOLN: 30.0000 mg | Freq: Four times a day (QID) | INTRAMUSCULAR | Status: AC | PRN

## 2020-11-04 MED ORDER — OXYTOCIN-SODIUM CHLORIDE 30-0.9 UT/500ML-% IV SOLN
INTRAVENOUS | Status: AC
  Filled 2020-11-04: qty 500

## 2020-11-04 MED ORDER — PROMETHAZINE HCL 25 MG/ML IJ SOLN: 6.2500 mg | INTRAMUSCULAR | Status: DC | PRN

## 2020-11-04 MED ORDER — SODIUM BICARBONATE 8.4 % IV SOLN
INTRAVENOUS | Status: DC | PRN
  Administered 2020-11-04: 6 mL via EPIDURAL

## 2020-11-04 MED ORDER — DIPHENHYDRAMINE HCL 25 MG PO CAPS: 25.0000 mg | ORAL_CAPSULE | ORAL | Status: DC | PRN

## 2020-11-04 MED ORDER — SIMETHICONE 80 MG PO CHEW
80.0000 mg | CHEWABLE_TABLET | Freq: Three times a day (TID) | ORAL | Status: DC
  Administered 2020-11-04 – 2020-11-07 (×8): 80 mg via ORAL
  Filled 2020-11-04 (×9): qty 1

## 2020-11-04 MED ORDER — SODIUM CHLORIDE 0.9 % IV SOLN: 500.0000 mg | INTRAVENOUS | Status: DC

## 2020-11-04 MED ORDER — DEXAMETHASONE SODIUM PHOSPHATE 4 MG/ML IJ SOLN
INTRAMUSCULAR | Status: AC
  Filled 2020-11-04: qty 1

## 2020-11-04 MED ORDER — LIDOCAINE HCL (PF) 1 % IJ SOLN
INTRAMUSCULAR | Status: DC | PRN
  Administered 2020-11-04: 2 mL via EPIDURAL
  Administered 2020-11-04: 10 mL via EPIDURAL

## 2020-11-04 MED ORDER — IBUPROFEN 600 MG PO TABS
600.0000 mg | ORAL_TABLET | Freq: Four times a day (QID) | ORAL | Status: AC
Start: 2020-11-04 — End: 2020-11-07
  Administered 2020-11-04 – 2020-11-07 (×12): 600 mg via ORAL
  Filled 2020-11-04 (×13): qty 1

## 2020-11-04 MED ORDER — KETOROLAC TROMETHAMINE 30 MG/ML IJ SOLN: 30.0000 mg | Freq: Once | INTRAMUSCULAR | Status: DC | PRN

## 2020-11-04 MED ORDER — FENTANYL CITRATE (PF) 100 MCG/2ML IJ SOLN
INTRAMUSCULAR | Status: DC | PRN
  Administered 2020-11-04: 100 ug via INTRAVENOUS

## 2020-11-04 MED ORDER — NALBUPHINE HCL 10 MG/ML IJ SOLN: 5.0000 mg | INTRAMUSCULAR | Status: DC | PRN

## 2020-11-04 MED ORDER — OXYCODONE HCL 5 MG PO TABS: 5.0000 mg | ORAL_TABLET | Freq: Once | ORAL | Status: DC | PRN

## 2020-11-04 MED ORDER — CEFAZOLIN SODIUM-DEXTROSE 2-4 GM/100ML-% IV SOLN: 2.0000 g | INTRAVENOUS | Status: DC

## 2020-11-04 MED ORDER — FENTANYL CITRATE (PF) 100 MCG/2ML IJ SOLN
INTRAMUSCULAR | Status: DC | PRN
  Administered 2020-11-04: 100 ug via EPIDURAL

## 2020-11-04 MED ORDER — CEFAZOLIN SODIUM-DEXTROSE 2-3 GM-%(50ML) IV SOLR
INTRAVENOUS | Status: DC | PRN
  Administered 2020-11-04: 2 g via INTRAVENOUS

## 2020-11-04 MED ORDER — MEPERIDINE HCL 25 MG/ML IJ SOLN: 6.2500 mg | INTRAMUSCULAR | Status: DC | PRN

## 2020-11-04 MED ORDER — NALOXONE HCL 0.4 MG/ML IJ SOLN: 0.4000 mg | INTRAMUSCULAR | Status: DC | PRN

## 2020-11-04 MED ORDER — SODIUM CHLORIDE 0.9 % IV SOLN: INTRAVENOUS | Status: AC

## 2020-11-04 MED ORDER — SIMETHICONE 80 MG PO CHEW
80.0000 mg | CHEWABLE_TABLET | ORAL | Status: DC | PRN
  Administered 2020-11-05: 80 mg via ORAL

## 2020-11-04 MED ORDER — SODIUM CHLORIDE 0.9 % IV SOLN
500.0000 mg | Freq: Once | INTRAVENOUS | Status: AC
  Administered 2020-11-04: 500 mg via INTRAVENOUS
  Filled 2020-11-04: qty 25

## 2020-11-04 MED ORDER — PHENYLEPHRINE HCL-NACL 20-0.9 MG/250ML-% IV SOLN
INTRAVENOUS | Status: AC
  Filled 2020-11-04: qty 250

## 2020-11-04 MED ORDER — SODIUM CHLORIDE 0.9 % IV SOLN
INTRAVENOUS | Status: AC
  Filled 2020-11-04: qty 500

## 2020-11-04 MED ORDER — LACTATED RINGERS IV BOLUS
500.0000 mL | Freq: Once | INTRAVENOUS | Status: AC
  Administered 2020-11-04: 500 mL via INTRAVENOUS

## 2020-11-04 MED ORDER — EPHEDRINE 5 MG/ML INJ
INTRAVENOUS | Status: AC
  Filled 2020-11-04: qty 5

## 2020-11-04 MED ORDER — KETOROLAC TROMETHAMINE 30 MG/ML IJ SOLN
INTRAMUSCULAR | Status: DC | PRN
  Administered 2020-11-04: 30 mg via INTRAVENOUS

## 2020-11-04 MED ORDER — ACETAMINOPHEN 10 MG/ML IV SOLN
INTRAVENOUS | Status: DC | PRN
  Administered 2020-11-04: 1000 mg via INTRAVENOUS

## 2020-11-04 MED ORDER — OXYCODONE HCL 5 MG/5ML PO SOLN: 5.0000 mg | Freq: Once | ORAL | Status: DC | PRN

## 2020-11-04 MED ORDER — ALBUMIN HUMAN 5 % IV SOLN
12.5000 g | Freq: Once | INTRAVENOUS | Status: AC
  Administered 2020-11-04: 12.5 g via INTRAVENOUS

## 2020-11-04 MED ORDER — OXYCODONE-ACETAMINOPHEN 5-325 MG PO TABS
1.0000 | ORAL_TABLET | ORAL | Status: DC | PRN
  Administered 2020-11-05 – 2020-11-07 (×9): 1 via ORAL
  Filled 2020-11-04 (×6): qty 1
  Filled 2020-11-04: qty 2
  Filled 2020-11-04: qty 1

## 2020-11-04 MED ORDER — DIPHENHYDRAMINE HCL 25 MG PO CAPS: 25.0000 mg | ORAL_CAPSULE | Freq: Four times a day (QID) | ORAL | Status: DC | PRN

## 2020-11-04 MED ORDER — HYDROMORPHONE HCL 1 MG/ML IJ SOLN: 0.2500 mg | INTRAMUSCULAR | Status: DC | PRN

## 2020-11-04 MED ORDER — ONDANSETRON HCL 4 MG/2ML IJ SOLN
INTRAMUSCULAR | Status: AC
  Filled 2020-11-04: qty 2

## 2020-11-04 MED ORDER — SODIUM CHLORIDE 0.9 % IV SOLN
INTRAVENOUS | Status: DC | PRN
  Administered 2020-11-04: 60 ug/min via INTRAVENOUS

## 2020-11-04 MED ORDER — ACETAMINOPHEN 10 MG/ML IV SOLN
INTRAVENOUS | Status: AC
  Filled 2020-11-04: qty 100

## 2020-11-04 MED ORDER — SODIUM CHLORIDE 0.9% FLUSH: 3.0000 mL | INTRAVENOUS | Status: DC | PRN

## 2020-11-04 MED ORDER — ENOXAPARIN SODIUM 40 MG/0.4ML IJ SOSY
40.0000 mg | PREFILLED_SYRINGE | INTRAMUSCULAR | Status: DC
  Administered 2020-11-05 – 2020-11-06 (×2): 40 mg via SUBCUTANEOUS
  Filled 2020-11-04 (×2): qty 0.4

## 2020-11-04 MED ORDER — OXYTOCIN-SODIUM CHLORIDE 30-0.9 UT/500ML-% IV SOLN
2.5000 [IU]/h | INTRAVENOUS | Status: AC
  Administered 2020-11-04: 2.5 [IU]/h via INTRAVENOUS
  Filled 2020-11-04: qty 500

## 2020-11-04 MED ORDER — ACETAMINOPHEN 500 MG PO TABS
1000.0000 mg | ORAL_TABLET | Freq: Four times a day (QID) | ORAL | Status: AC
  Administered 2020-11-04 – 2020-11-05 (×4): 1000 mg via ORAL
  Filled 2020-11-04 (×4): qty 2

## 2020-11-04 MED ORDER — KETOROLAC TROMETHAMINE 30 MG/ML IJ SOLN
INTRAMUSCULAR | Status: AC
  Filled 2020-11-04: qty 1

## 2020-11-04 MED ORDER — LACTATED RINGERS IV SOLN: INTRAVENOUS | Status: DC | PRN

## 2020-11-04 SURGICAL SUPPLY — 25 items
CHLORAPREP W/TINT 26ML (MISCELLANEOUS) ×2 IMPLANT
CLAMP CORD UMBIL (MISCELLANEOUS) IMPLANT
DRSG OPSITE POSTOP 4X10 (GAUZE/BANDAGES/DRESSINGS) ×2 IMPLANT
ELECT REM PT RETURN 9FT ADLT (ELECTROSURGICAL) ×2
ELECTRODE REM PT RTRN 9FT ADLT (ELECTROSURGICAL) ×1 IMPLANT
EXTRACTOR VACUUM M CUP 4 TUBE (SUCTIONS) IMPLANT
GLOVE BIOGEL PI IND STRL 6.5 (GLOVE) ×1 IMPLANT
GLOVE BIOGEL PI IND STRL 7.0 (GLOVE) ×1 IMPLANT
GLOVE BIOGEL PI INDICATOR 6.5 (GLOVE) ×1
GLOVE BIOGEL PI INDICATOR 7.0 (GLOVE) ×1
GLOVE SURG SS PI 6.5 STRL IVOR (GLOVE) ×2 IMPLANT
GOWN STRL REUS W/TWL LRG LVL3 (GOWN DISPOSABLE) ×4 IMPLANT
KIT ABG SYR 3ML LUER SLIP (SYRINGE) IMPLANT
NEEDLE HYPO 25X5/8 SAFETYGLIDE (NEEDLE) IMPLANT
NS IRRIG 1000ML POUR BTL (IV SOLUTION) ×2 IMPLANT
PACK C SECTION WH (CUSTOM PROCEDURE TRAY) ×2 IMPLANT
PAD OB MATERNITY 4.3X12.25 (PERSONAL CARE ITEMS) ×2 IMPLANT
PENCIL SMOKE EVAC W/HOLSTER (ELECTROSURGICAL) ×2 IMPLANT
RTRCTR C-SECT PINK 25CM LRG (MISCELLANEOUS) IMPLANT
SUT PLAIN 0 NONE (SUTURE) IMPLANT
SUT VIC AB 0 CT1 36 (SUTURE) ×8 IMPLANT
SUT VIC AB 4-0 KS 27 (SUTURE) ×2 IMPLANT
TOWEL OR 17X24 6PK STRL BLUE (TOWEL DISPOSABLE) ×2 IMPLANT
TRAY FOLEY W/BAG SLVR 14FR LF (SET/KITS/TRAYS/PACK) ×2 IMPLANT
WATER STERILE IRR 1000ML POUR (IV SOLUTION) ×2 IMPLANT

## 2020-11-04 NOTE — Transfer of Care (Signed)
Immediate Anesthesia Transfer of Care Note  Patient: Colleen Avila  Procedure(s) Performed: CESAREAN SECTION  Patient Location: PACU  Anesthesia Type:Epidural  Level of Consciousness: awake, alert  and oriented  Airway & Oxygen Therapy: Patient Spontanous Breathing  Post-op Assessment: Report given to RN and Post -op Vital signs reviewed and stable  Post vital signs: Reviewed and stable  Last Vitals:  Vitals Value Taken Time  BP 110/71 11/04/20 0738  Temp    Pulse 76 11/04/20 0741  Resp 20 11/04/20 0741  SpO2 98 % 11/04/20 0741  Vitals shown include unvalidated device data.  Last Pain:  Vitals:   11/04/20 0529  TempSrc: Oral  PainSc:          Complications: No notable events documented.

## 2020-11-04 NOTE — Anesthesia Postprocedure Evaluation (Signed)
Anesthesia Post Note  Patient: Shamikia Arango-Sosa  Procedure(s) Performed: [place]     Patient location during evaluation: Mother Baby Anesthesia Type: Epidural Level of consciousness: oriented and awake and alert Pain management: pain level controlled Vital Signs Assessment: post-procedure vital signs reviewed and stable Respiratory status: spontaneous breathing and respiratory function stable Cardiovascular status: blood pressure returned to baseline and stable Postop Assessment: no headache, no backache, no apparent nausea or vomiting and able to ambulate Anesthetic complications: no   No notable events documented.  Last Vitals:  Vitals:   11/04/20 1015 11/04/20 1130  BP: 100/61 102/62  Pulse: 78 70  Resp: 17 18  Temp: 36.7 C 36.7 C  SpO2: 96% 97%    Last Pain:  Vitals:   11/04/20 1130  TempSrc: Oral  PainSc: 0-No pain                 Belenda Cruise P Bradyn Vassey

## 2020-11-04 NOTE — Progress Notes (Signed)
Continued minimal variability with occasional variables despite pit remaining off, amnioinfusion, BP control, and position changes.   Discussed proceeding with C-section after speaking with Dr Elly Modena. Educated on risks and benefits with spanish interpreter. During conversation, FHT showing return of moderate variability without decelerations. Patient wishes to monitor further if able to do, will continue to monitor for the next hour (or sooner if needed). If return to NRFHT, will proceed with cesarean. Patient understood and agrees.   Patriciaann Clan, DO

## 2020-11-04 NOTE — Progress Notes (Signed)
Interim progress note:  Informed by RN that patient felt lightheaded with standing. Orthostatic vitals WNL, BP lower-normal (SBP 90-100s). Had not been on fluids, started NS 180ml x 12 hrs and evaluated at bedside with in-person spanish interpreter.   Patient reports she overall feels well, but does feel lightheaded with position changes. In further conversation, she also endorses a sensation of the rooming spinning with certain position changes and rapid head movement for the past 1.5 months. Known history of perceived decreasing hearing and tinnitus for the past 1.5 months as well that has not worsening since delivery. No ENT referral yet. Hgb 13 post-op>9.2. Denies any SOB, CP. Generally feeling alittle tired, did not sleep well last night either.   Blood pressure 104/68, pulse 73, temperature 98.7 F (37.1 C), temperature source Oral, resp. rate 18, height 5' 1.5" (1.562 m), weight 78.4 kg, last menstrual period 01/31/2020, SpO2 97 %, unknown if currently breastfeeding.  Gen: No acute distress, breastfeeding comfortable sitting up in bed Cardiac: normal pulses Pulm: Normal WOB speaking in full sentences  Abdomen: pressure and honey comb clean, dry, and intact. No dried bleeding visualized.   A/p  POD#1 pLTCS Subacute (1.5 months) Vertigo with perceived hearing loss, consider meniere's vs atypical BPPV  Lightheadedness, in setting of acute blood loss post-operative anemia and low BP  Cont NS 155ml/hr x12 hrs Offered IV iron and discussed risks and benefits, pt to proceed. IV venofer ordered. ENT referral on discharge, instructed to discuss with o/p provider as well Discuss epley maneuver in the am, can consider meclizine if needed   Patriciaann Clan, DO

## 2020-11-04 NOTE — Lactation Note (Addendum)
This note was copied from a baby's chart. Lactation Consultation Note  Patient Name: Colleen Avila DSKAJ'G Date: 11/04/2020 Reason for consult: Initial assessment;Term Age:38 hours Consult was done in Spanish:  [place] in to room for initial consult. Mother states infant has been latching well, no pain or discomfort. Assisted with pillows and positioning. Infant breastfed with audible swallows for 12 minutes. Demonstrated hand expression and spoonfed 1 mL of colostrum. Mother verbalized a little discomfort with HE, LC provided a hand pump.  Reviewed normal newborn behavior during first 24h, expected output and feeding frequency. Talked about tummy size consistent with current colostrum amount. Reviewed formula volume when supplementing.   Plan: 1-Skin to skin, aim for a deep, comfortable latch and breastfeed on demand or 8-12 times in 24h period. 2-Encouraged maternal rest, hydration and food intake.  3-Contact LC as needed for feeds/support/concerns/questions   All questions answered at this time. Provided Lactation services brochure and promoted [employer] booklet information.    Maternal Data Has patient been taught Hand Expression?: Yes Does the patient have breastfeeding experience prior to this delivery?: Yes How long did the patient breastfeed?: >12 months x2  Feeding Mother's Current Feeding Choice: Breast Milk and Formula Nipple Type: Slow - flow  LATCH Score Latch: Grasps breast easily, tongue down, lips flanged, rhythmical sucking.  Audible Swallowing: Spontaneous and intermittent  Type of Nipple: Everted at rest and after stimulation  Comfort (Breast/Nipple): Soft / non-tender  Hold (Positioning): Assistance needed to correctly position infant at breast and maintain latch.  LATCH Score: 9  Interventions Interventions: Breast feeding basics reviewed;Assisted with latch;Skin to skin;Breast massage;Hand express;Adjust position;Hand pump;Expressed milk;Position  options;Support pillows;Education;Pace feeding  Discharge Pump: Manual WIC Program: Yes  Consult Status Consult Status: Follow-up Date: 11/05/20 Follow-up type: In-patient    Teresita Fanton A Higuera Ancidey 11/04/2020, 1:41 PM

## 2020-11-04 NOTE — Discharge Summary (Signed)
Postpartum Discharge Summary  Date of Service updated     Patient Name: Colleen Avila DOB: Oct 19, 1980 MRN: [id number]  Date of admission: 11/03/2020 Delivery date:11/04/2020  Delivering provider: Patriciaann Clan  Date of discharge: 11/07/2020  Admitting diagnosis: Indication for care in labor and delivery, antepartum [O75.9] Intrauterine pregnancy: [redacted]w[redacted]d    Secondary diagnosis:  Active Problems:   Indication for care in labor and delivery, antepartum   Delivery of pregnancy by cesarean section  Additional problems: Acute blood loss post-operative anemia    Discharge diagnosis: Term Pregnancy Delivered                                              Post partum procedures: IV venofer Augmentation: Pitocin, Cytotec, and IP Foley Complications: None  Hospital course: Induction of Labor With Cesarean Section   40y.o. yo G3P3003 at 470w1das admitted to the hospital 11/03/2020 for induction of labor. Patient had a labor course significant for induction/augmentation until about 6cm. Poor tracing with minimal variability and recurrent variables despite interventions. The patient went for cesarean section due to Non-Reassuring FHR. Delivery details are as follows: Membrane Rupture Time/Date: 1:47 AM ,11/04/2020   Delivery Method:C-Section, Vacuum Assisted  Details of operation can be found in separate operative Note.  Patient had an uncomplicated postpartum course. She is ambulating, tolerating a regular diet, passing flatus, and urinating well.  Patient is discharged home in stable condition on 11/07/20.      History of hearing loss, tinnitus, and vertigo for the past 1.5-2 months. Placed ENT referral on discharge, however instructed to also let her postpartum/PCP know in case this does not go through as often has difficulty with inpatient placement. Provided contact information for ENT that accepts medicaid in her AVS to call.   Newborn Data: Birth date:11/04/2020  Birth time:6:37 AM   Gender:Female  Living status:Living  Apgars:8 ,9  Weight:3666 g                                Magnesium Sulfate received: No BMZ received: No Rhophylac:No MMR:No T-DaP:Given prenatally Flu: N/A Transfusion: IV venofer only  Physical exam  Vitals:   11/05/20 2015 11/06/20 0620 11/06/20 1011 11/06/20 2325  BP: 104/63 99/60 117/71 (!) 100/56  Pulse: 73 83 89 68  Resp: 18 18 20 16   Temp: 98.4 F (36.9 C) 98.5 F (36.9 C) 98.5 F (36.9 C) 98.7 F (37.1 C)  TempSrc: Oral Oral Oral Oral  SpO2: 99% 98% 99%   Weight:      Height:       General: alert, cooperative, and no distress Lochia: appropriate Uterine Fundus: firm Incision: Healing well with no significant drainage, Dressing is clean, dry, and intact DVT Evaluation: No evidence of DVT seen on physical exam. No significant calf/ankle edema. Labs: Lab Results  Component Value Date   WBC 8.7 11/05/2020   HGB 8.1 (L) 11/05/2020   HCT 24.3 (L) 11/05/2020   MCV 90.3 11/05/2020   PLT 194 11/05/2020   CMP Latest Ref Rng & Units 11/03/2020  Glucose 70 - 99 mg/dL -  BUN 6 - 23 mg/dL -  Creatinine 0.44 - 1.00 mg/dL 0.52  Sodium 135 - 145 mEq/L -  Potassium 3.5 - 5.1 mEq/L -  Chloride 96 - 112 mEq/L -  CO2 19 - 32 mEq/L -  Calcium 8.4 - 10.5 mg/dL -  Total Protein 6.0 - 8.3 g/dL -  Total Bilirubin 0.3 - 1.2 mg/dL -  Alkaline Phos 39 - 117 U/L -  AST 0 - 37 U/L -  ALT 0 - 35 U/L -   Edinburgh Score: Edinburgh Postnatal Depression Scale Screening Tool 11/04/2020  I have been able to laugh and see the funny side of things. 0  I have looked forward with enjoyment to things. 1  I have blamed myself unnecessarily when things went wrong. 2  I have been anxious or worried for no good reason. 2  I have felt scared or panicky for no good reason. 0  Things have been getting on top of me. 1  I have been so unhappy that I have had difficulty sleeping. 2  I have felt sad or miserable. 1  I have been so unhappy that I have  been crying. 1  The thought of harming myself has occurred to me. 0  Edinburgh Postnatal Depression Scale Total 10     After visit meds:  Allergies as of 11/07/2020   No Known Allergies      Medication List     TAKE these medications    FeroSul 325 (65 FE) MG tablet Generic drug: ferrous sulfate Take 1 tablet (325 mg total) by mouth every other day.   ibuprofen 600 MG tablet Commonly known as: ADVIL Take 1 tablet (600 mg total) by mouth every 6 (six) hours as needed.   oxyCODONE-acetaminophen 5-325 MG tablet Commonly known as: PERCOCET/ROXICET Take 1 tablet by mouth every 6 (six) hours as needed for moderate pain.   prenatal multivitamin Tabs tablet Take 1 tablet by mouth daily at 12 noon.   Senexon-S 8.6-50 MG tablet Generic drug: senna-docusate Take 2 tablets by mouth daily.         Discharge home in stable condition Infant Feeding: Breast Infant Disposition:home with mother Discharge instruction: per After Visit Summary and Postpartum booklet. Activity: Advance as tolerated. Pelvic rest for 6 weeks.  Diet: routine diet Future Appointments: Future Appointments  Date Time Provider [place]  11/11/2020  1:30 PM [facility] NURSE [facility]   Follow up Visit:  Prenatal care at HD. Message sent to [facility] by Dr Higinio Plan on 11/04/2020 for incision visit:   Please schedule this patient for a In person incision visit in 1 week with the following provider: RN. Low risk pregnancy complicated by:  None Delivery mode:  C-Section, Vacuum Assisted  Anticipated Birth Control:  Unsure   11/07/2020 Patriciaann Clan, DO

## 2020-11-04 NOTE — Progress Notes (Signed)
Labor Progress Note Colleen Avila is a 40 y.o. G3P2002 at [redacted]w[redacted]d presented for IOL due to post-dates.   Called by RN at [place] due to prolonged deceleration that started at approximately 0124-0125. She just had her epidural placed at 0110. BP was 60/40's, given phenylephrine x 1 and LR bolus. Pit stopped. Placed on left lateral with unchanged cervical exam. HR returned to baseline at around 0130.   Shortly after this, had SROM with moderate meconium stained fluid. Had return of some moderate variability with occasional variables with contractions. Restarted pit at 1. However, then began to have minimal variability and still occasional variables despite position changes. Dr. Elly Modena made aware of earlier prolonged and current FHT. Pit stopped. Rechecked cervix, similar to previous 6/70/-2,-1. Placed IUPC and started amnioinfusion. BP improved around 90-100/70's. Will monitor closely.   Used spanish interpreter via ipad to update patient during each encounter.    Patriciaann Clan, DO 3:01 AM

## 2020-11-04 NOTE — Plan of Care (Addendum)
Received call from nursing that pt's blood pressures have been gradually decreasing this afternoon down to 85/57 with associated dizziness. Saw pt at bedside. She describes dizziness as feeling "off balance". This has been a recurrent issue for the past 20 days but acutely worse today. Feels associated fullness and diminished hearing in left ear. She was seen at [facility] 9/18 for similar complaint and recommended to follow up with audiologist. No vertigo, LOC, shortness of breath, chest pain, new extremity swelling. No severe abdominal pain or distension. No bleeding from surgical site. Reports mild, improving lochia, which is less than a period. She notes she has eaten very little since yesterday. She is afraid to stand up, because she is worried this will worsen her dizziness.  Patient appears well, is alert, with no dyspnea. Heart sounds with regular rhythm and rate, no murmur, and lung sounds clear bilaterally. Fundus firm and appropriately tender, wound dressing is clean with no blood. Trace, nonpitting upper and lower extremity edema.   No evidence of acute blood loss at this time. Suspect dizziness and hypotension are related to poor PO intake. Possible eustachian tube vs vestibular dysfunction may be contributing to her dizziness. She is eating dinner now. Have placed orders for additional fluid bolus, H/H, and CBC. Will continue to monitor pressures.  Lattie Corns, PGY-1, Faculty Service 6:34pm, 11/04/2020

## 2020-11-04 NOTE — Anesthesia Preprocedure Evaluation (Signed)
Anesthesia Evaluation  Patient identified by MRN, date of birth, ID band Patient awake    Reviewed: Allergy & Precautions, Patient's Chart, lab work & pertinent test results  Airway Mallampati: II  TM Distance: >3 FB Neck ROM: Full    Dental no notable dental hx.    Pulmonary neg pulmonary ROS,    Pulmonary exam normal breath sounds clear to auscultation       Cardiovascular negative cardio ROS Normal cardiovascular exam Rhythm:Regular Rate:Normal     Neuro/Psych negative neurological ROS  negative psych ROS   GI/Hepatic negative GI ROS, Neg liver ROS,   Endo/Other  negative endocrine ROS  Renal/GU negative Renal ROS  negative genitourinary   Musculoskeletal negative musculoskeletal ROS (+)   Abdominal   Peds  Hematology negative hematology ROS (+) hct 36.9, plt 264   Anesthesia Other Findings   Reproductive/Obstetrics (+) Pregnancy                             Anesthesia Physical Anesthesia Plan  ASA: 2  Anesthesia Plan: Epidural   Post-op Pain Management:    Induction:   PONV Risk Score and Plan: 2  Airway Management Planned: Natural Airway  Additional Equipment: None  Intra-op Plan:   Post-operative Plan:   Informed Consent: I have reviewed the patients History and Physical, chart, labs and discussed the procedure including the risks, benefits and alternatives for the proposed anesthesia with the patient or authorized representative who has indicated his/her understanding and acceptance.       Plan Discussed with:   Anesthesia Plan Comments:         Anesthesia Quick Evaluation

## 2020-11-04 NOTE — Anesthesia Procedure Notes (Signed)
Epidural Patient location during procedure: OB Start time: 11/04/2020 1:01 AM End time: 11/04/2020 1:14 AM  Staffing Anesthesiologist: Pervis Hocking, DO Performed: anesthesiologist   Preanesthetic Checklist Completed: patient identified, IV checked, risks and benefits discussed, monitors and equipment checked, pre-op evaluation and timeout performed  Epidural Patient position: sitting Prep: DuraPrep and site prepped and draped Patient monitoring: continuous pulse ox, blood pressure, heart rate and cardiac monitor Approach: midline Location: L3-L4 Injection technique: LOR air  Needle:  Needle type: Tuohy  Needle gauge: 17 G Needle length: 9 cm Needle insertion depth: 6 cm Catheter type: closed end flexible Catheter size: 19 Gauge Catheter at skin depth: 11 cm Test dose: negative  Assessment Sensory level: T8 Events: blood not aspirated, injection not painful, no injection resistance, no paresthesia and negative IV test  Additional Notes Patient identified. Risks/Benefits/Options discussed with patient including but not limited to bleeding, infection, nerve damage, paralysis, failed block, incomplete pain control, headache, blood pressure changes, nausea, vomiting, reactions to medication both or allergic, itching and postpartum back pain. Confirmed with bedside nurse the patient's most recent platelet count. Confirmed with patient that they are not currently taking any anticoagulation, have any bleeding history or any family history of bleeding disorders. Patient expressed understanding and wished to proceed. All questions were answered. Sterile technique was used throughout the entire procedure. Please see nursing notes for vital signs. Test dose was given through epidural catheter and negative prior to continuing to dose epidural or start infusion. Warning signs of high block given to the patient including shortness of breath, tingling/numbness in hands, complete motor  block, or any concerning symptoms with instructions to call for help. Patient was given instructions on fall risk and not to get out of bed. All questions and concerns addressed with instructions to call with any issues or inadequate analgesia.  Reason for block:procedure for pain

## 2020-11-04 NOTE — Progress Notes (Signed)
Spoke with Dr. Higinio Plan regarding pt status. Patient completed orthostatic VS but once she stood up, pt reported seeing a black hole when looking down. Patient and/or nurse did not feel she was ready to walk to the bathroom so she was placed back into bed in semi-fowlers position. Patient is eating and drinking well. She has good output in foley catheter. She does complain of hearing loss that makes her feel "off-balance". Dr. Higinio Plan aware of all the above and she is in the patient room now. Dr. Higinio Plan would like to hold off on stronger pain medication for now until BP is more stable, pt okay with holding off on medication until due time around 2300. Order also given for normal saline @ 125 ml/hr. Will follow up with Dr. Higinio Plan and continue to monitor.

## 2020-11-04 NOTE — Progress Notes (Signed)
Assisted RN Janett Billow with admission information to her room and plan of care explanation, ordered pt meals, by [place].

## 2020-11-04 NOTE — Op Note (Signed)
Colleen Avila PROCEDURE DATE: 11/03/2020 - 11/04/2020  PREOPERATIVE DIAGNOSIS: Intrauterine pregnancy at  [redacted]w[redacted]d weeks gestation; non-reassuring fetal status  POSTOPERATIVE DIAGNOSIS: The same  PROCEDURE:     Cesarean Section  SURGEON:  Dr. Mora Bellman  ASSISTANT: Dr. Higinio Plan  INDICATIONS: Colleen Avila is a 40 y.o. [id number] at [redacted]w[redacted]d scheduled for cesarean section secondary to non-reassuring fetal status.  The risks of cesarean section discussed with the patient included but were not limited to: bleeding which may require transfusion or reoperation; infection which may require antibiotics; injury to bowel, bladder, ureters or other surrounding organs; injury to the fetus; need for additional procedures including hysterectomy in the event of a life-threatening hemorrhage; placental abnormalities wth subsequent pregnancies, incisional problems, thromboembolic phenomenon and other postoperative/anesthesia complications. The patient concurred with the proposed plan, giving informed written consent for the procedure.    FINDINGS:  Viable female infant in cephalic presentation, nuchal cord x 1.  Apgars not available at the time of note.  Clear amniotic fluid.  Intact placenta, three vessel cord.  Normal uterus, fallopian tubes and ovaries bilaterally.  ANESTHESIA:    Spinal INTRAVENOUS FLUIDS:1300 ml ESTIMATED BLOOD LOSS: 700 ml URINE OUTPUT:  200 ml SPECIMENS: Placenta sent to L&D COMPLICATIONS: None immediate  PROCEDURE IN DETAIL:  The patient received intravenous antibiotics and had sequential compression devices applied to her lower extremities while in the preoperative area.  She was then taken to the operating room where anesthesia was induced and was found to be adequate. A foley catheter was placed into her bladder and attached to Moreen Piggott gravity. She was then placed in a dorsal supine position with a leftward tilt, and prepped and draped in a sterile manner. After an adequate timeout  was performed, a Pfannenstiel skin incision was made with scalpel and carried through to the underlying layer of fascia. The fascia was incised in the midline and this incision was extended bilaterally using the Mayo scissors. Kocher clamps were applied to the superior aspect of the fascial incision and the underlying rectus muscles were dissected off bluntly. A similar process was carried out on the inferior aspect of the facial incision. The rectus muscles were separated in the midline bluntly and the peritoneum was entered bluntly. The Alexis self-retaining retractor was introduced into the abdominal cavity. Attention was turned to the lower uterine segment where a bladder flap was created, and a transverse hysterotomy was made with a scalpel and extended bilaterally bluntly. The infant was successfully delivered with the assistance of vaccum and delayed cord clamping was performed for 1 minute. The cord was clamped and cut and infant was handed over to awaiting neonatology team. Uterine massage was then administered and the placenta delivered intact with three-vessel cord. The uterus was cleared of clot and debris.  The hysterotomy was closed with 0 Vicryl in a running locked fashion, and an imbricating layer was also placed with a 0 Vicryl. Overall, excellent hemostasis was noted. The pelvis copiously irrigated and cleared of all clot and debris. Hemostasis was confirmed on all surfaces.  The peritoneum and the muscles were reapproximated using 0 vicryl interrupted stitches. The fascia was then closed using 0 Vicryl in a running fashion.  The skin was closed in a subcuticular fashion using 3.0 Vicryl. The patient tolerated the procedure well. Sponge, lap, instrument and needle counts were correct x 2. She was taken to the recovery room in stable condition.    Greogory Cornette ConstantMD  11/04/2020 7:09 AM

## 2020-11-04 NOTE — Progress Notes (Signed)
Patient ID: Colleen Avila, female   DOB: September 19, 1980, 41 y.o.   MRN: [id number] Patient found lying in bed comfortably. Spanish interpreter used to provide update on fetal status Discussed fetal return of fetal heart rate with minimal variability and deep variable decelerations. Expressed concerns that augmentation cannot be restarted due to current fetal heart tracing. Discussed delivery via cesarean section. Risks, benefits and alternatives were explained including but not limited to risks of bleeding infection and damage to adjacent organs. Patient verbalized understanding and is now agreeable to proceed with a cesarean delivery.  FHT: baseline 125, min variability, no accels, + variable decels TOCO: irregular contractions  A/P 41 yo P2 at [redacted]w[redacted]d for postdate IOL with non reassuring fetal heart tracing - Will proceed with primary cesarean section - Orders entered - patient is undecided on contraception at this time

## 2020-11-05 LAB — CBC
HCT: 24.3 % — ABNORMAL LOW (ref 36.0–46.0)
Hemoglobin: 8.1 g/dL — ABNORMAL LOW (ref 12.0–15.0)
MCH: 30.1 pg (ref 26.0–34.0)
MCHC: 33.3 g/dL (ref 30.0–36.0)
MCV: 90.3 fL (ref 80.0–100.0)
Platelets: 194 10*3/uL (ref 150–400)
RBC: 2.69 MIL/uL — ABNORMAL LOW (ref 3.87–5.11)
RDW: 13.8 % (ref 11.5–15.5)
WBC: 8.7 10*3/uL (ref 4.0–10.5)
nRBC: 0 % (ref 0.0–0.2)

## 2020-11-05 MED ORDER — MECLIZINE HCL 12.5 MG PO TABS
12.5000 mg | ORAL_TABLET | Freq: Two times a day (BID) | ORAL | Status: DC | PRN
  Filled 2020-11-05: qty 1

## 2020-11-05 MED ORDER — SODIUM CHLORIDE 0.9 % IV BOLUS
500.0000 mL | Freq: Once | INTRAVENOUS | Status: AC
  Administered 2020-11-05: 500 mL via INTRAVENOUS

## 2020-11-05 NOTE — Social Work (Addendum)
CSW received consult for [place] 10. CSW met with MOB to offer support and complete assessment. Spanish Interpreter used.   CSW met with MOB at bedside and introduced CSW role. CSW observed MOB eating lunch and infant sleeping in the bassinet and MOB brother at bedside. MOB presented pleasant and welcomed CSW visit with her brother present. CSW inquired how MOB has felt since giving birth. CSW observed infant resting in bassinet.  MOB expressed feeling uncomfortable and in pain due to her blood pressure being low. CSW validated MOB feelings. CSW discussed MOB [place]. MOB shared she was feeling anxious during the Labor and Delivery because the infant would not position properly, then released meconium so she had an emergency c-section. MOB shared her brother was there to provide support and her has been supportive throughout her entire pregnancy. CSW inquired if MOB has history of mental health. MOB reported no history of mental health, therapy, or medication treatment. CSW inquired about MOB coping skills. MOB shared she asks her brother for help and support. CSW praised MOB for her efforts. CSW provided education regarding the baby blues period vs. perinatal mood disorders, discussed treatment and gave resources for mental health follow up if concerns arise.  CSW recommended MOB complete a self-evaluation during the postpartum time period using the New Mom Checklist from Postpartum Progress and encouraged MOB to contact a medical professional if symptoms are noted at any time. MOB shared she feels comfortable reaching out to her doctor is she has concerns. CSW assessed MOB for safety. MOB denied thoughts of harm to self and others.     CSW provided review of Sudden Infant Death Syndrome (SIDS) precautions. MOB reported understanding and MOB brother took notes. MOB reported she has essential items for the infant including a crib where the infant will sleep. MOB has chosen [facility] for the  infant's follow up care. MOB confirmed she has transportation to appointments. MOB shared she receives WIC/FS benefits and will notify them of the infant's birth. CSW assessed MOB for additional needs. MOB reported no further needs.   *CSW completed the assessment between two visits.  CSW identifies no further need for intervention and no barriers to discharge at this time.   Colleen Avila, MSW, LCSW [facility]  Clinical Social Worker  [phone] 11/05/2020  3:51 PM  

## 2020-11-05 NOTE — Progress Notes (Addendum)
POSTPARTUM PROGRESS NOTE  POD #1  Subjective:  Colleen Avila is a 40 y.o. G3P3003 s/p LTCS at [redacted]w[redacted]d. She overall is feeling well, didn't get much sleep last night. Hasn't been up overnight with foley still in place/SCDs. Has not had any lightheadedness/dizziness with sitting, only with position changes from sitting to standing.  No problems with po intake. Denies nausea or vomiting. She has passed flatus. Pain is well controlled.  Lochia is mild.  Spanish interpreter via ipad was used in addition to in-person spanish interpreter upon discussion of RBCs.    Objective: Blood pressure (!) 87/45, pulse 64, temperature 98.3 F (36.8 C), temperature source Oral, resp. rate 16, height 5' 1.5" (1.562 m), weight 78.4 kg, last menstrual period 01/31/2020, SpO2 100 %, unknown if currently breastfeeding.  Physical Exam:  General: alert, cooperative and no distress Chest: no respiratory distress Heart:regular rate, distal pulses intact Uterine Fundus: firm, appropriately tender DVT Evaluation: No calf swelling or tenderness Extremities: minimal edema Skin: warm, dry; incision clean/dry/intact w/ honeycomb dressing in place, no bleeding seen  Recent Labs    11/04/20 1826 11/05/20 0641  HGB 9.2* 8.1*  HCT 26.4* 24.3*    Assessment/Plan: Colleen Avila is a 40 y.o. G3P3003 s/p pLTCS at [redacted]w[redacted]d for NRFHTs.  POD#1 - Doing well; pain well controlled.   Routine postpartum care  Lovenox for VTE prophylaxis  Remove foley this am  Suspected peripheral vertigo: Known perceived left hearing loss, tinnitus, and dizziness--suspicious for meniere's. Needs ENT referral at discharge. PRN Meclizine.    Acute blood loss Anemia: Hgb 13>9.2, received IV venofer 9/28pm >8.1 this am. Difficult to assess if orthostatic symptoms are related to vertigo vs continued symptomatic anemia. However with associated lower blood pressures (SBP 80's, BL SBP 90-100s), discussed transfusing 1U pRBCs with patient to  assess improvement. However, she would like monitor her BP/symptoms throughout the morning which is reasonable. If still having difficulty with her sx and lower blood pressures by mid afternoon, pt is willing to reconsider 1U pRBCs.   Contraception: Unsure Feeding: breast  Dispo: Plan for discharge likely POD#3.   LOS: 2 days   Patriciaann Clan, DO  11/05/2020, 8:54 AM

## 2020-11-05 NOTE — Progress Notes (Addendum)
Notified Dr. Higinio Plan of pt recent BP of 77/42, patient resting during BP. No new symptoms. 500 ml NS bolus ordered and then return to 125 ml/hr NS, and recheck BP in 30 minutes. Will continue to monitor.

## 2020-11-05 NOTE — Lactation Note (Signed)
This note was copied from a baby's chart. Lactation Consultation Note  Patient Name: Colleen Avila Date: 11/05/2020 Reason for consult: Follow-up assessment;Term;Other (Comment) (C/S) Age:38 hours In house Spanish interpreter used: Colleen Avila  Per mom, breastfeeding is going well, most feedings are 20 to 30 minutes in length. Mom feeding choice is breast and formula feeding, mom recently given infant 30 mls of formula and LC did not observe latch.  [place] reminded mom to breastfeed infant according to feeding cues, 8 to 12+ or more times within 24 hours , skin to skin. If infant is sleepy closed to 4 hours wake infant, LC discussed this with mom due to report by morning RN that infant had went past 4 hours without feeding mom was waiting for infant to wake up on its own. Mom had a lot of blankets in basinet and LC discussed infant safe sleeping and SIDS prevention. Mom knows to call RN/LC if she has any questions, concerns or needs assistance with latching infant at the breast/. Mom's plan: 1- Mom will latch infant at the breast according to feeding cues, 8 to 12+ times within 24 hours, skin to skin. 2- After latching infant at breast mom will supplement infant with formula ( Mom's choice) and offer Day 2 ( 7 to 15 mls) per feeding, sheet was given in [place]. 3- Mom knows to call RN/ LC if she has any questions, concerns or needs assistance with latch.  Maternal Data    Feeding Mother's Current Feeding Choice: Breast Milk and Formula  LATCH Score                    Lactation Tools Discussed/Used    Interventions    Discharge    Consult Status Consult Status: Follow-up Date: 11/06/20 Follow-up type: In-patient    Colleen Avila 11/05/2020, 7:33 PM

## 2020-11-05 NOTE — Progress Notes (Signed)
Notified by RN most recent BP check 80/41. Patient feeling well at this time. BP has intermittently been ranging 38-177'N systolic since yesterday with low baseline. IV venofer currently running, which has halted NS infusion.   Discussed with RN. IV iron about to be complete very shortly. Will transition once done and bolus reminder of 300cc NS in the bag and return to 125 ml/hr. Recheck BP in 30 min, sooner if needed.   Patriciaann Clan, DO

## 2020-11-05 NOTE — Progress Notes (Signed)
Updated by Dr. Higinio Plan, will start IV iron when it arrives from pharmacy. Suggested by MD to allow patient to rest tonight and remove catheter in the morning. Will continue to monitor.

## 2020-11-06 ENCOUNTER — Other Ambulatory Visit (HOSPITAL_COMMUNITY): Payer: Self-pay

## 2020-11-06 MED ORDER — AMMONIA AROMATIC IN INHA
RESPIRATORY_TRACT | Status: AC
  Filled 2020-11-06: qty 10

## 2020-11-06 MED ORDER — IBUPROFEN 600 MG PO TABS
600.0000 mg | ORAL_TABLET | Freq: Four times a day (QID) | ORAL | 0 refills | Status: DC | PRN
  Filled 2020-11-06: qty 30, 8d supply, fill #0

## 2020-11-06 MED ORDER — SENNOSIDES-DOCUSATE SODIUM 8.6-50 MG PO TABS
2.0000 | ORAL_TABLET | Freq: Every day | ORAL | 0 refills | Status: AC
  Filled 2020-11-06: qty 15, 7d supply, fill #0

## 2020-11-06 MED ORDER — OXYCODONE-ACETAMINOPHEN 5-325 MG PO TABS
1.0000 | ORAL_TABLET | Freq: Four times a day (QID) | ORAL | 0 refills | Status: DC | PRN
  Filled 2020-11-06: qty 10, 3d supply, fill #0

## 2020-11-06 MED ORDER — FERROUS SULFATE 325 (65 FE) MG PO TABS
325.0000 mg | ORAL_TABLET | ORAL | 0 refills | Status: DC
  Filled 2020-11-06: qty 30, 60d supply, fill #0

## 2020-11-06 MED ORDER — FERROUS SULFATE 325 (65 FE) MG PO TABS
325.0000 mg | ORAL_TABLET | ORAL | Status: DC
  Administered 2020-11-06: 325 mg via ORAL
  Filled 2020-11-06: qty 1

## 2020-11-06 NOTE — Progress Notes (Signed)
POSTPARTUM PROGRESS NOTE  POD #2  Subjective:  Colleen Avila is a 40 y.o. G3P3003 s/p pLTCS at [redacted]w[redacted]d.  She reports she doing well, feeling better than yesterday. Still intermittently having a brief "room spinning" sensation with position changes and hearing loss on the left, but otherwise well. No acute events overnight. She denies any problems with ambulating, voiding or po intake. Denies nausea or vomiting. She has passed flatus. Pain is moderately controlled.  Lochia is mild.  Objective: Blood pressure 99/60, pulse 83, temperature 98.5 F (36.9 C), temperature source Oral, resp. rate 18, height 5' 1.5" (1.562 m), weight 78.4 kg, last menstrual period 01/31/2020, SpO2 98 %, unknown if currently breastfeeding.  Physical Exam:  General: alert, cooperative and no distress Chest: no respiratory distress Heart:regular rate, distal pulses intact Uterine Fundus: firm, appropriately tender DVT Evaluation: No calf swelling or tenderness Extremities: minimal edema Skin: warm, dry; incision clean/dry/intact w/ honeycomb dressing in place  Recent Labs    11/04/20 1826 11/05/20 0641  HGB 9.2* 8.1*  HCT 26.4* 24.3*    Assessment/Plan: Tieara Avila is a 40 y.o. G3P3003 s/p pLTCS at [redacted]w[redacted]d for NRFHTs.  POD#2 - Doing well overall; pain semi controlled.   Routine postpartum care  OOB, ambulated  Lovenox for VTE prophylaxis  Acute blood loss Anemia: Hgb 8.1, declined RBCs but s/p IV venofer. asymptomatic (intermittent dizziness appears to be more related to vertigo)   Start ferrous sulfate today.   Vertigo  perceived hearing loss: >1.5 months. Meclizine PRN. ENT referral on dc.   Contraception: IUD op  Feeding: Breastfeeding   Dispo: Plan for discharge tomorrow.   LOS: 3 days   Colleen Hillock, DO  OB Fellow  11/06/2020, 8:43 AM

## 2020-11-07 NOTE — Lactation Note (Signed)
This note was copied from a baby's chart. Lactation Consultation Note  Patient Name: Colleen Avila Today's Date: 11/07/2020 Reason for consult: Follow-up assessment;Term;Nipple pain/trauma Age:38 days  LC in to room prior to discharge. "Colleen Avila" is sleeping upon arrival. Mother reports baby breastfeeds non-stop and nipples are sore and damaged because of aggressive suckling. Reinforced positioning with support pillows, deep latch and explored other positions.  Mother is mildly engorged and seems to be in a lot of pain with latch as well as when using the pump. Nipples are cracked and evidence of bleeding. Mother reports very strong uterine contractions when breastfeeding. Mother states she will use formula and pump to rest/heal nipples. Reviewed pace bottle-feed, upright position with pauses and frequent burping, in addition to guidelines for formula feeding.  Encouraged mother to used EBM to nipples, air-dry and use comfort gels for healing purposes. Discussed local resources to support her breastfeeding goals such as WIC and [facility] Lactation Services warm line.   Plan: 1-Feeding on demand or 8-12 times in 24h period. 2-Pump or hand-express and offer first EBM. 3-EBM to nipples, air-dry and use comfort gels/coconut oil  4-Encouraged maternal rest, hydration and food intake.   Contact LC as needed for feeds/support/concerns/questions. All questions answered at this time. Reviewed [place] brochure.        Feeding Mother's Current Feeding Choice: Breast Milk and Formula  LATCH Score Latch: Grasps breast easily, tongue down, lips flanged, rhythmical sucking.  Audible Swallowing: Spontaneous and intermittent  Type of Nipple: Everted at rest and after stimulation  Comfort (Breast/Nipple): Engorged, cracked, bleeding, large blisters, severe discomfort  Hold (Positioning): Assistance needed to correctly position infant at breast and maintain latch.  LATCH Score: 7   Lactation Tools  Discussed/Used Tools: Pump;Flanges;Coconut oil Flange Size: 24 Breast pump type: Double-Electric Breast Pump;Manual Pump Education: Setup, frequency, and cleaning;Milk Storage Reason for Pumping: niple trauma Pumping frequency: q3 Pumped volume: 10 mL  Interventions Interventions: Breast feeding basics reviewed;Assisted with latch;Skin to skin;Breast massage;Hand express;Adjust position;Breast compression;DEBP;Hand pump;Comfort gels;Coconut oil;Expressed milk;Position options;Support pillows;Education;Pace feeding;LC Services brochure  Discharge Discharge Education: Engorgement and breast care;Warning signs for feeding baby Pump: DEBP;Manual;Personal WIC Program: Yes  Consult Status Consult Status: Complete Date: 11/07/20 Follow-up type: Call as needed    [place] 11/07/2020, 3:35 PM

## 2020-11-07 NOTE — Progress Notes (Signed)
Patient is POD#3 and will be discharging home today.   She reported an episode of chest pressure this morning with breast pumping and resolved shortly after. No further pain. Denies any current CP, SOB, palpitations, lightheadedness/dizziness, LE swelling.   Gen: NAD Cardiac: RRR without murmur Pulm: Normal WOB, clear b/l anterior/posterior  Extremities: No significant LE edema bilaterally, non-tender   Provided reassurance. Low suspicion for cardiac/pulm etiology. Provided MAU precautions.   Colleen Clan, DO

## 2020-11-07 NOTE — Discharge Instructions (Signed)
Call to see the ear specialist:   Ear, Nose, and Throat Doctor Parsons, [place], [place], [place] [address] [place], [place] [address] [phone]

## 2020-11-07 NOTE — Lactation Note (Signed)
This note was copied from a baby's chart. Lactation Consultation Note  Patient Name: Colleen Avila Today's Date: 11/07/2020 Reason for consult: Follow-up assessment;Difficult latch;Mother's request Age:38 hours, term female infant with -7% weight loss. Per mom, infant has been breastfeeding for 30, 45 minutes to 1 hour most feeding today. Per mom, her breast are hurting with pain and are engorged. LC used hand pump and ice to soften breast, infant latched with 24 mm NS as protective barrier due to mom's pain, nipple stripes and abrasions on both nipples.  LC did suck training with infant, infant has very strong suck, this was LC's first time observing a latch, mom mostly been using the cradle hold position with infant, questionable previously latch and postioning.  LC ask mom to ask Pediatrician to look at infant's oral cavity.  When breast tissue soften, LC assisted mom with latching infant on her left breast using the football hold position with 24 mm NS, infant breastfed for 14 minutes. After latching infant at breast, mom re- iced and  used DEBP, pumping  11 mls of colostrum and mom  re- iced again in laid back position. While mom was icing, Mom's cousin gave infant 25 mls of formula. Mom's plan:  1- Mom will continue to breastfeed infant according to feeding cues , 8 to 12+ times within 24 hours and will use 24 mm NS as protective barrier with latching until nipples heal. 2-Mom will ask RN/LC for latch assistance or assistance with using 24 mm NS if needed.  3- Mom knows to latch infant if breast are feeling full or to pump to prevent engorgement. 4- Mom will pump every 3 hours for 15 minutes and give infant back any EBM before formula.   Maternal Data    Feeding Mother's Current Feeding Choice: Breast Milk and Formula  LATCH Score Latch: Grasps breast easily, tongue down, lips flanged, rhythmical sucking. (with 24 mm NS)  Audible Swallowing: Spontaneous and  intermittent  Type of Nipple: Everted at rest and after stimulation  Comfort (Breast/Nipple): Filling, red/small blisters or bruises, mild/mod discomfort  Hold (Positioning): Assistance needed to correctly position infant at breast and maintain latch.  LATCH Score: 8   Lactation Tools Discussed/Used Tools: Pump Breast pump type: Double-Electric Breast Pump Pump Education: Setup, frequency, and cleaning;Milk Storage Reason for Pumping: Mom is engorged, nipple pain with abraisons, nipple stripes on both breast. Pumping frequency: Mom will pump every 3 hours for 15 minutes. Pumped volume: 11 mL  Interventions Interventions: Skin to skin;Breast compression;Adjust position;Support pillows;Position options;Expressed milk;Coconut oil;Comfort gels;Hand pump;DEBP;Education;Ice;Assisted with latch  Discharge    Consult Status Consult Status: Follow-up Date: 11/07/20 Follow-up type: In-patient    Colleen Avila 11/07/2020, 3:47 AM

## 2020-11-11 ENCOUNTER — Ambulatory Visit (INDEPENDENT_AMBULATORY_CARE_PROVIDER_SITE_OTHER): Payer: Medicaid Other

## 2020-11-11 ENCOUNTER — Other Ambulatory Visit: Payer: Self-pay

## 2020-11-11 VITALS — BP 123/83 | HR 85 | Wt 157.1 lb

## 2020-11-11 DIAGNOSIS — Z5189 Encounter for other specified aftercare: Secondary | ICD-10-CM

## 2020-11-11 NOTE — Progress Notes (Signed)
Pt here today for wound check s/p c-section on 11/04/20.  With Spanish Interpreter Eda R., incision observed to be well approximated, no odor, no drainage, mild edema (pt reports that she wears an abdominal binder all day), no redness.  Pt advised to allow warm, soapy water to run over incision and pat dry.  Pt reports that she is having a lot of pain.  I advised pt to not wear the abdominal binder at this time until she is able to get her pain under control.  Encouraged pt to please take the ibuprofen every 6 hours and the oxycodone as needed to help with the pain.  Pt advised to call the office with incision concerns and to make her pp visit appt with [facility].  Pt stated that she was told by the [facility] to call in the third week of October to schedule pp appt.    Mel Almond, RN  11/11/20

## 2020-11-13 NOTE — Progress Notes (Signed)
Patient was assessed and managed by nursing staff during this encounter. I have reviewed the chart and agree with the documentation and plan.   Gaylan Gerold, MSN, CNM, [facility] 11/13/20 11:43 AM

## 2020-11-16 ENCOUNTER — Telehealth (HOSPITAL_COMMUNITY): Payer: Self-pay

## 2020-11-16 DIAGNOSIS — Z1331 Encounter for screening for depression: Secondary | ICD-10-CM

## 2020-11-16 NOTE — Telephone Encounter (Signed)
No answer. No voicemail set up yet.  Sharyn Lull [facility] 11/16/2020,1743

## 2020-11-20 ENCOUNTER — Telehealth: Payer: Self-pay | Admitting: Clinical

## 2020-11-20 NOTE — Telephone Encounter (Signed)
Attempt to contact pt after referral via [place] interpreter, [place], [phone]: Unable to leave message as both voicemail and MyChart are not set up.

## 2020-11-23 ENCOUNTER — Other Ambulatory Visit: Payer: Self-pay

## 2020-12-14 ENCOUNTER — Ambulatory Visit (HOSPITAL_COMMUNITY)
Admission: EM | Admit: 2020-12-14 | Discharge: 2020-12-14 | Disposition: A | Discharge status: Home / Self Care | Payer: Medicaid Other | Attending: Internal Medicine | Admitting: Internal Medicine

## 2020-12-14 ENCOUNTER — Encounter (HOSPITAL_COMMUNITY): Payer: Self-pay | Admitting: Emergency Medicine

## 2020-12-14 ENCOUNTER — Other Ambulatory Visit: Payer: Self-pay

## 2020-12-14 DIAGNOSIS — B349 Viral infection, unspecified: Secondary | ICD-10-CM

## 2020-12-14 DIAGNOSIS — N644 Mastodynia: Secondary | ICD-10-CM | POA: Diagnosis not present

## 2020-12-14 LAB — POC INFLUENZA A AND B ANTIGEN (URGENT CARE ONLY)
INFLUENZA A ANTIGEN, POC: NEGATIVE
INFLUENZA B ANTIGEN, POC: NEGATIVE

## 2020-12-14 NOTE — ED Provider Notes (Signed)
[place]    CSN: [id number] Arrival date & time: 12/14/20  1711      History   Chief Complaint Chief Complaint  Patient presents with   Fever    HPI Colleen Avila is a 40 y.o. female to the urgent care with a 2-day history of generalized body aches, fever and headache.  Patient has a 85-month-old baby.  Patient denies any sore throat, nausea or vomiting.  She also complains of sharp pain in the left breast which has been ongoing for the past week or 2.  Patient was advised to stop breast-feeding and pump breastmilk.  She started breast-feeding a few days ago.  She denies any swelling in the left breast.  Patient denies any vomiting or diarrhea.  No sick contacts.   HPI  Past Medical History:  Diagnosis Date   Fibroid    History of recurrent UTIs     Patient Active Problem List   Diagnosis Date Noted   Delivery of pregnancy by cesarean section 11/04/2020   Indication for care in labor and delivery, antepartum 11/03/2020    Past Surgical History:  Procedure Laterality Date   CESAREAN SECTION N/A 11/04/2020   Procedure: CESAREAN SECTION;  Surgeon: Mora Bellman, MD;  Location: [facility];  Service: Obstetrics;  Laterality: N/A;   CHOLECYSTECTOMY      OB History     Gravida  3   Para  3   Term  3   Preterm      AB      Living  3      SAB      IAB      Ectopic      Multiple  0   Live Births  1            Home Medications    Prior to Admission medications   Medication Sig Start Date End Date Taking? Authorizing Provider  acetaminophen (TYLENOL) 325 MG tablet Take 650 mg by mouth every 6 (six) hours as needed.   Yes [provider]  ibuprofen (ADVIL) 600 MG tablet Take 1 tablet (600 mg total) by mouth every 6 (six) hours as needed. 11/06/20   Patriciaann Clan, DO  senna-docusate (SENOKOT-S) 8.6-50 MG tablet Take 2 tablets by mouth daily. 11/06/20   Patriciaann Clan, DO    Family History Family History  Problem  Relation Age of Onset   Diabetes Maternal Grandmother    Hypertension Maternal Grandmother    Diabetes Maternal Grandfather    Hypertension Maternal Grandfather    Diabetes Paternal Grandmother    Hypertension Paternal Grandmother    Diabetes Paternal Grandfather    Hypertension Paternal Grandfather     Social History Social History   Tobacco Use   Smoking status: Never   Smokeless tobacco: Never  Vaping Use   Vaping Use: Never used  Substance Use Topics   Alcohol use: Never   Drug use: Never     Allergies   Patient has no known allergies.   Review of Systems Review of Systems  Constitutional:  Positive for chills and fever.  HENT:  Negative for rhinorrhea and sore throat.   Respiratory: Negative.    Gastrointestinal: Negative.   Musculoskeletal:  Positive for arthralgias and myalgias.  Neurological:  Positive for headaches.    Physical Exam Triage Vital Signs ED Triage Vitals  Enc Vitals Group     BP 12/14/20 1921 112/62     Pulse Rate 12/14/20 1921 92  Resp 12/14/20 1921 20     Temp 12/14/20 1921 (!) 100.5 F (38.1 C)     Temp Source 12/14/20 1921 Oral     SpO2 12/14/20 1921 97 %     Weight --      Height --      Head Circumference --      Peak Flow --      Pain Score 12/14/20 1916 6     Pain Loc --      Pain Edu? --      Excl. in [place]? --    No data found.  Updated Vital Signs BP 112/62 (BP Location: Right Arm)   Pulse 92   Temp (!) 100.5 F (38.1 C) (Oral)   Resp 20   SpO2 97%   Visual Acuity Right Eye Distance:   Left Eye Distance:   Bilateral Distance:    Right Eye Near:   Left Eye Near:    Bilateral Near:     Physical Exam Exam conducted with a chaperone present.  Constitutional:      Appearance: Normal appearance.  HENT:     Right Ear: Tympanic membrane normal.     Left Ear: Tympanic membrane normal.     Mouth/Throat:     Mouth: Mucous membranes are moist.     Pharynx: No posterior oropharyngeal erythema.  Eyes:      General:        Right eye: No discharge.        Left eye: No discharge.     Extraocular Movements: Extraocular movements intact.  Cardiovascular:     Rate and Rhythm: Normal rate and regular rhythm.     Pulses: Normal pulses.     Heart sounds: Normal heart sounds.  Pulmonary:     Effort: Pulmonary effort is normal.     Breath sounds: Normal breath sounds.  Genitourinary:    Comments: No masses palpated in the breast.  No overlying erythema.  Breast is slightly engorged. Neurological:     Mental Status: She is alert.     UC Treatments / Results  Labs (all labs ordered are listed, but only abnormal results are displayed) Labs Reviewed  POC INFLUENZA A AND B ANTIGEN (URGENT CARE ONLY)    EKG   Radiology No results found.  Procedures Procedures (including critical care time)  Medications Ordered in UC Medications - No data to display  Initial Impression / Assessment and Plan / UC Course  I have reviewed the triage vital signs and the nursing notes.  Pertinent labs & imaging results that were available during my care of the patient were reviewed by me and considered in my medical decision making (see chart for details).     1.  Viral illness Point-of-care flu is negative Tylenol as needed for fever Maintain adequate hydration Return precautions given  2.  Breast engorgement: Continue breast-feeding Warm compress If you notice worsening pain, swelling or erythema please return to urgent care immediately to be reevaluated. Final Clinical Impressions(s) / UC Diagnoses   Final diagnoses:  Viral illness  Breast pain     Discharge Instructions      Your flu test is negative Tylenol or Motrin as needed for pain Warm compress to the left breast If you notice redness, worsening pain or swelling please return to urgent care to be reevaluated.   ED Prescriptions   None    PDMP not reviewed this encounter.   Chase Picket, MD 12/17/20 [phone]

## 2020-12-14 NOTE — Discharge Instructions (Addendum)
Your flu test is negative Tylenol or Motrin as needed for pain Warm compress to the left breast If you notice redness, worsening pain or swelling please return to urgent care to be reevaluated.

## 2020-12-14 NOTE — ED Triage Notes (Signed)
Body aches, fever, headache.  Patient had a baby one month ago.  Patient is having pain on the left side of abdomen.  Patient did hve c-section .  Patient is breast feeding. Tylenol helps body aches, but not headache

## 2021-02-10 ENCOUNTER — Telehealth: Payer: Self-pay | Admitting: Clinical

## 2021-02-10 NOTE — Telephone Encounter (Signed)
Attempt to schedule, per referral; Unable to leave phone message as voicemail is not set up, per [address], [place], [place]; Unable to leave MyChart message as not set up.

## 2022-07-25 ENCOUNTER — Ambulatory Visit: Payer: Medicaid Other | Attending: Student | Admitting: Audiology

## 2022-07-25 ENCOUNTER — Ambulatory Visit: Payer: Medicaid Other | Admitting: Audiology

## 2022-07-25 DIAGNOSIS — H938X3 Other specified disorders of ear, bilateral: Secondary | ICD-10-CM | POA: Diagnosis present

## 2022-07-25 DIAGNOSIS — H9193 Unspecified hearing loss, bilateral: Secondary | ICD-10-CM | POA: Diagnosis present

## 2022-07-26 NOTE — Procedures (Signed)
  [facility] [address] [place], [place]  [address] [phone]  AUDIOLOGICAL  EVALUATION  NAME: Colleen Avila     DOB:   05/11/1980      MRN: [id number]                                                                                     DATE: 07/26/2022     STATUS: Outpatient DIAGNOSIS: Aural fullness   History: Charlese was seen for an audiological evaluation due to decreased hearing occurring for a few months. Valrie was accompanied to the evaluation by a Spanish interpreter. Shenay reports she is attending ESL classes and is having difficulty understand the teacher and difficulty learning English. Derita reports increased difficulty hearing her children and difficulty hearing her children in the car. Dajanae reports bilateral aural fullness and intermittent dizziness. She denies otalgia and tinnitus. Annaliz reports she feels a "popping" sensation in her ears and can hear better. Kyrah reports chronic congestion and feels this is affecting her hearing sensitivity.   Evaluation:  Otoscopy showed a clear view of the tympanic membranes, bilaterally Tympanometry results were consistent with normal middle ear function (Type A), bilaterally.  Audiometric testing was completed using Conventional Audiometry techniques with insert earphones and TDH headphones. Test results are consistent with normal hearing sensitivity at [id number] Hz, bilaterally. Speech Recognition Thresholds were obtained at 10 dB HL in the right ear and at 10  dB HL in the left ear in Spanish. Word Recognition Testing was completed at 50 dB HL and Aracelia scored 100% with a Spanish word list.      Results:  The test results were reviewed with Ketina via the Spanish interpreter. Today's results are consistent with normal hearing sensitivity in both ears. Dylanie was counseled regarding the results and counseled regarding good communication strategies.   Recommendations: 1.   No further  audiological testing is recommended at this time unless future hearing concerns arise.    30 minutes spent testing and counseling on results.   If you have any questions please feel free to contact me at [phone].  [facility] Audiologist, Au.D., CCC-A 07/26/2022  2:03 PM  Cc: Pcp, No

## 2022-09-22 ENCOUNTER — Other Ambulatory Visit (INDEPENDENT_AMBULATORY_CARE_PROVIDER_SITE_OTHER): Payer: Medicaid Other

## 2022-09-22 ENCOUNTER — Encounter: Payer: Self-pay | Admitting: Physical Medicine and Rehabilitation

## 2022-09-22 ENCOUNTER — Ambulatory Visit (INDEPENDENT_AMBULATORY_CARE_PROVIDER_SITE_OTHER): Payer: Medicaid Other | Admitting: Physical Medicine and Rehabilitation

## 2022-09-22 DIAGNOSIS — M5441 Lumbago with sciatica, right side: Secondary | ICD-10-CM

## 2022-09-22 DIAGNOSIS — G8929 Other chronic pain: Secondary | ICD-10-CM

## 2022-09-22 DIAGNOSIS — M5416 Radiculopathy, lumbar region: Secondary | ICD-10-CM

## 2022-09-22 MED ORDER — MELOXICAM 15 MG PO TABS: 15.0000 mg | ORAL_TABLET | Freq: Every day | ORAL | 0 refills | Status: DC

## 2022-09-22 NOTE — Progress Notes (Signed)
Colleen Avila - 42 y.o. female MRN [id number]  Date of birth: 1980-11-22  Office Visit Note: Visit Date: 09/22/2022 PCP: Harlen Labs, NP Referred by: Harlen Labs, NP  Subjective: Chief Complaint  Patient presents with   Lower Back - Pain   HPI: Colleen Avila is a 42 y.o. female who comes in today per the request of Harlen Labs, NP for evaluation of chronic, worsening and severe bilateral lower back pain radiating to right anterior thigh down to knee. Spanish interpreter present during our visit today. Pain started after she gave birth 2 years ago and has remained intermittent over time. Her pain becomes worse with prolonged sitting, she describes as sharp and stiff sensation, currently rates as 7 out of 10. Some relief of pain with home exercise regimen, rest and use of medications. Reports some relief of pain with lumbar brace. No history of formal physical therapy. She did have x-ray performed at PCP office in June, however we are not able to view imaging or report. Patient denies focal weakness, numbness and tingling. No recent trauma or falls.     Oswestry Disability Index Score 16% 0 to 10 (20%)   minimal disability: The patient can cope with most living activities. Usually no treatment is indicated apart from advice on lifting sitting and exercise.  Review of Systems  Musculoskeletal:  Positive for back pain.  Neurological:  Negative for tingling, sensory change, focal weakness and weakness.  All other systems reviewed and are negative.  Otherwise per HPI.  Assessment & Plan: Visit Diagnoses:    ICD-10-CM   1. Chronic bilateral low back pain with right-sided sciatica  M54.41    G89.29     2. Lumbar radiculopathy  M54.16 XR Lumbar Spine 2-3 Views    Ambulatory referral to Physical Therapy    MR LUMBAR SPINE WO CONTRAST       Plan: Findings:  Chronic, worsening and severe bilateral lower back pain radiating to right anterior thigh to knee.  Patient continues to have severe pain despite good conservative therapies such as home exercise regimen, rest and use of medications. Patients clinical presentation and exam are consistent with lumbar radiculopathy, more L4 pattern. I do feel her symptoms correlate with disc herniation as she does experience severe pain with sitting. Other differentials include sacroiliac joint dysfunction and myofascial pain syndrome. I obtained 2 view lumbar x-rays in the office today that exhibits increased lordosis, no spondylolisthesis, well preserved disc spacing. Given that her pain has been ongoing for 2 years, minimal relief of pain with conservative therapies I placed order for lumbar MRI imaging. I also placed order for short course of formal physical therapy and prescription for Meloxicam. I will see her back for lumbar MRI review, depending on results of imaging we discussed possibility of performing lumbar epidural steroid injection. No red flag symptoms noted upon exam today.       Meds & Orders:  Meds ordered this encounter  Medications   meloxicam (MOBIC) 15 MG tablet    Sig: Take 1 tablet (15 mg total) by mouth daily.    Dispense:  30 tablet    Refill:  0    Orders Placed This Encounter  Procedures   XR Lumbar Spine 2-3 Views   MR LUMBAR SPINE WO CONTRAST   Ambulatory referral to Physical Therapy    Follow-up: Return for lumbar MRI imaging.   Procedures: No procedures performed      Clinical History: No specialty comments available.   She reports  that she has never smoked. She has never used smokeless tobacco. No results for input(s): "HGBA1C", "LABURIC" in the last 8760 hours.  Objective:  VS:  HT:    WT:   BMI:     BP:   HR: bpm  TEMP: ( )  RESP:  Physical Exam Vitals and nursing note reviewed.  HENT:     Head: Normocephalic and atraumatic.     Right Ear: External ear normal.     Left Ear: External ear normal.     Nose: Nose normal.     Mouth/Throat:     Mouth: Mucous  membranes are moist.  Eyes:     Extraocular Movements: Extraocular movements intact.  Cardiovascular:     Rate and Rhythm: Normal rate.     Pulses: Normal pulses.  Pulmonary:     Effort: Pulmonary effort is normal.  Abdominal:     General: Abdomen is flat. There is no distension.  Musculoskeletal:        General: Tenderness present.     Cervical back: Normal range of motion.     Comments: Patient rises from seated position to standing without difficulty. Good lumbar range of motion. No pain noted with facet loading. 5/5 strength noted with bilateral hip flexion, knee flexion/extension, ankle dorsiflexion/plantarflexion and EHL. No clonus noted bilaterally. No pain upon palpation of greater trochanters. No pain with internal/external rotation of bilateral hips. Sensation intact bilaterally. Dysesthesias noted to right L4 dermatome. Positive slump test on the right. Ambulates without aid, gait steady.     Skin:    General: Skin is warm and dry.     Capillary Refill: Capillary refill takes less than 2 seconds.  Neurological:     General: No focal deficit present.     Mental Status: She is alert and oriented to person, place, and time.  Psychiatric:        Mood and Affect: Mood normal.        Behavior: Behavior normal.     Ortho Exam  Imaging: XR Lumbar Spine 2-3 Views  Result Date: 09/22/2022 AP and lateral radiographs show increased lumbar lordosis, no spondylolisthesis, well preserved disc height. No fractures or dislocations.    Past Medical/Family/Surgical/Social History: Medications & Allergies reviewed per EMR, new medications updated. Patient Active Problem List   Diagnosis Date Noted   Delivery of pregnancy by cesarean section 11/04/2020   Indication for care in labor and delivery, antepartum 11/03/2020   Past Medical History:  Diagnosis Date   Fibroid    History of recurrent UTIs    Family History  Problem Relation Age of Onset   Diabetes Maternal Grandmother     Hypertension Maternal Grandmother    Diabetes Maternal Grandfather    Hypertension Maternal Grandfather    Diabetes Paternal Grandmother    Hypertension Paternal Grandmother    Diabetes Paternal Grandfather    Hypertension Paternal Grandfather    Past Surgical History:  Procedure Laterality Date   CESAREAN SECTION N/A 11/04/2020   Procedure: CESAREAN SECTION;  Surgeon: Catalina Antigua, MD;  Location: [facility];  Service: Obstetrics;  Laterality: N/A;   CHOLECYSTECTOMY     Social History   Occupational History   Not on file  Tobacco Use   Smoking status: Never   Smokeless tobacco: Never  Vaping Use   Vaping status: Never Used  Substance and Sexual Activity   Alcohol use: Never   Drug use: Never   Sexual activity: Not Currently

## 2022-09-22 NOTE — Progress Notes (Signed)
Functional Pain Scale - descriptive words and definitions  Unmanageable (7)  Pain interferes with normal ADL's/nothing seems to help/sleep is very difficult/active distractions are very difficult to concentrate on. Severe range order  Average Pain 8  Lower back pain in the center that can radiate into the left leg to the knee

## 2022-10-19 ENCOUNTER — Ambulatory Visit: Payer: Medicaid Other

## 2022-10-24 ENCOUNTER — Telehealth: Payer: Self-pay | Admitting: Physical Medicine and Rehabilitation

## 2022-10-24 NOTE — Telephone Encounter (Signed)
Patient called needing to request a refill for her medication. CB#[phone]

## 2022-10-25 ENCOUNTER — Other Ambulatory Visit: Payer: Self-pay | Admitting: Physical Medicine and Rehabilitation

## 2022-10-25 MED ORDER — MELOXICAM 15 MG PO TABS: 15.0000 mg | ORAL_TABLET | Freq: Every day | ORAL | 0 refills | Status: AC

## 2022-10-25 NOTE — Telephone Encounter (Signed)
Patient needs refill of Meloxicam

## 2022-11-07 ENCOUNTER — Telehealth: Payer: Self-pay

## 2022-11-07 NOTE — Telephone Encounter (Signed)
Telephoned patient at mobile number using Interpreter#[phone]. Left voice message with [facility] ([employer]) contact information.

## 2022-12-15 ENCOUNTER — Ambulatory Visit: Payer: Medicaid Other | Admitting: Physical Medicine and Rehabilitation

## 2022-12-17 IMAGING — US US MFM OB FOLLOW-UP
1 series · 13 of 28 positions shown · non-contrast
Comparison: none

[Series 1: us mfm ob follow-up · 13 of 72 slices shown]
[im 3/72]
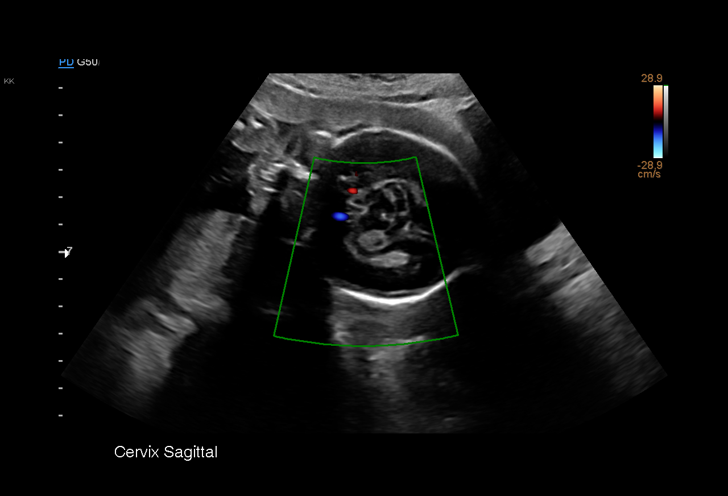
[im 8/72]
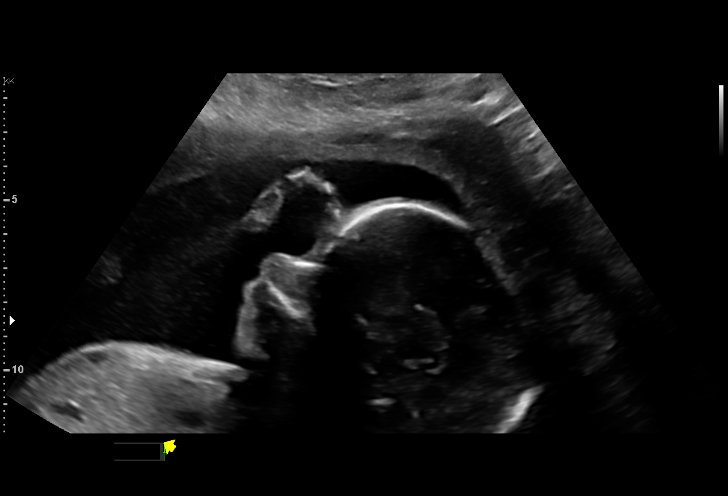
[im 14/72]
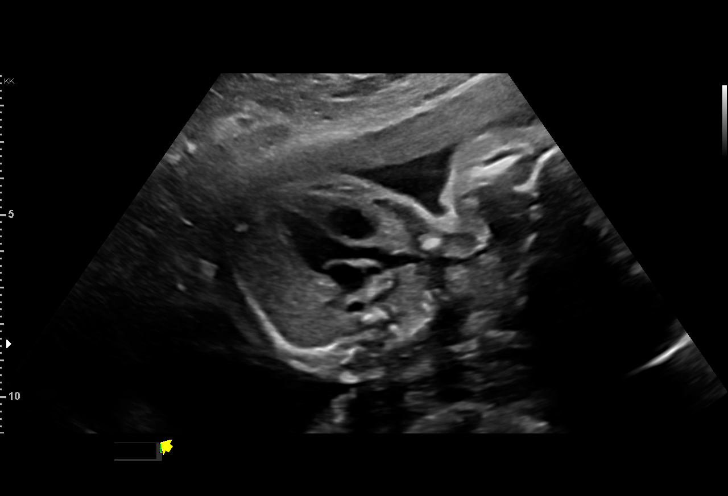
[im 19/72]
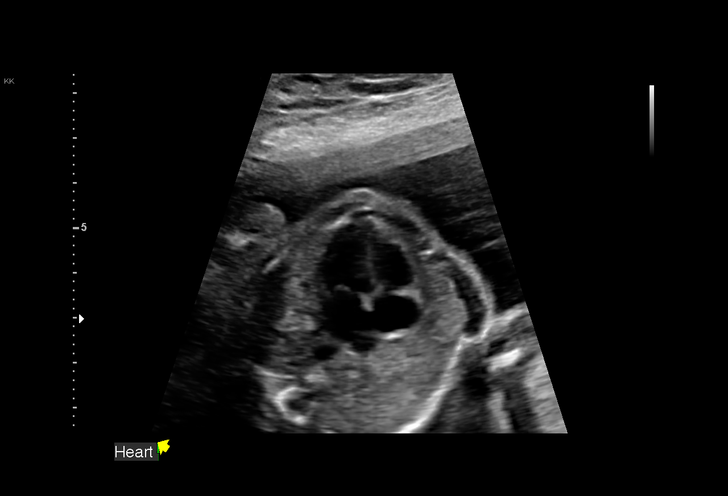
[im 24/72]
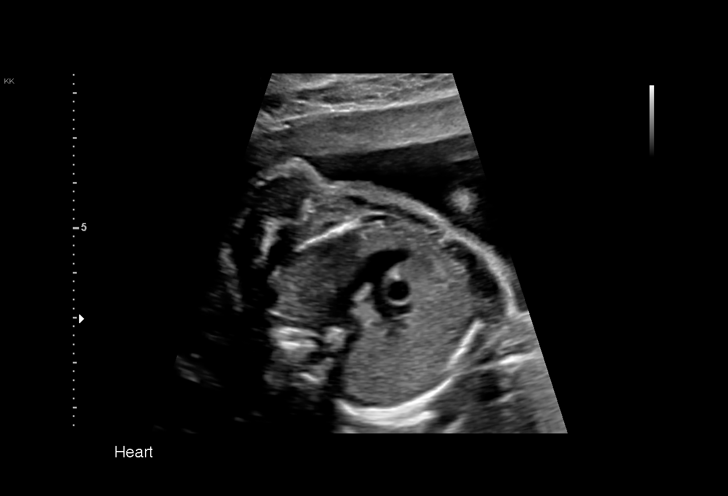
[im 29/72]
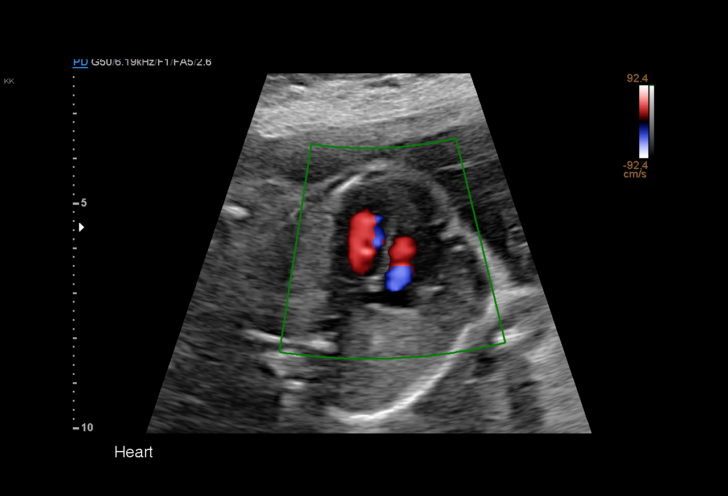
[im 37/72]
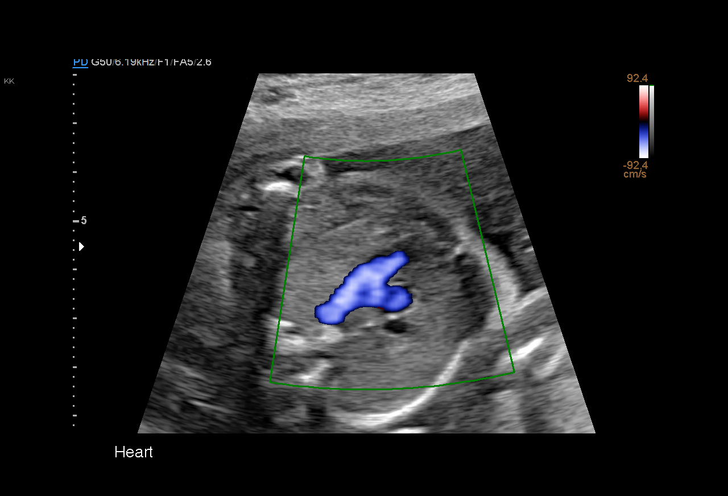
[im 43/72]
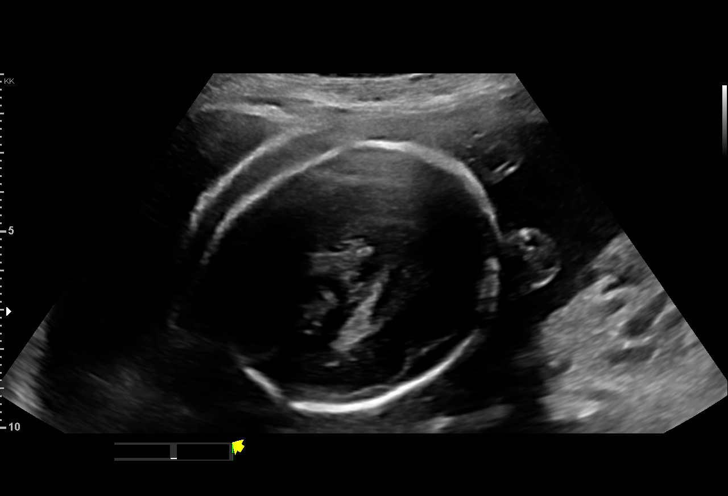
[im 48/72]
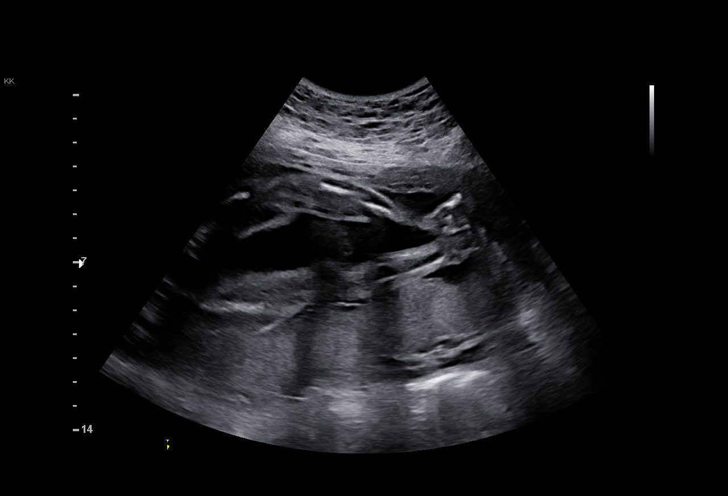
[im 53/72]
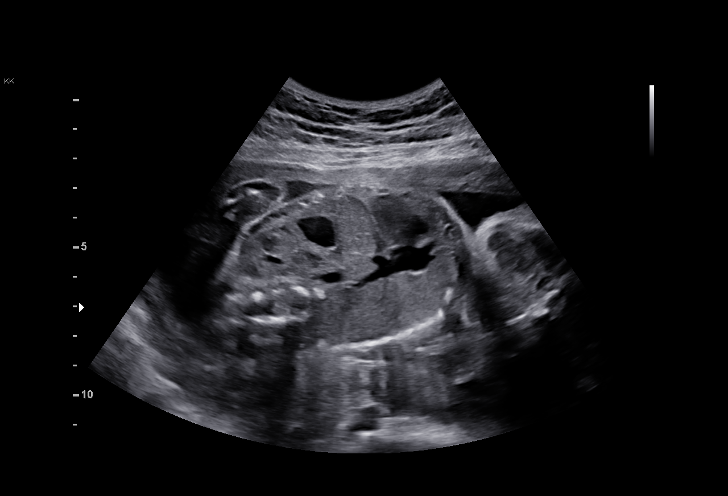
[im 58/72]
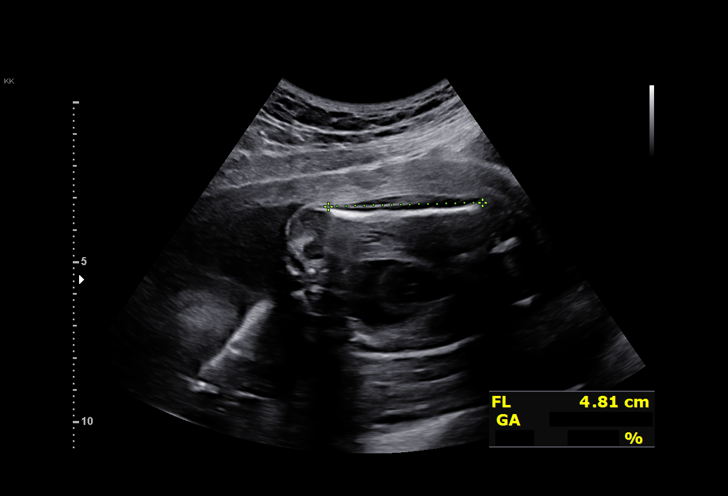
[im 64/72]
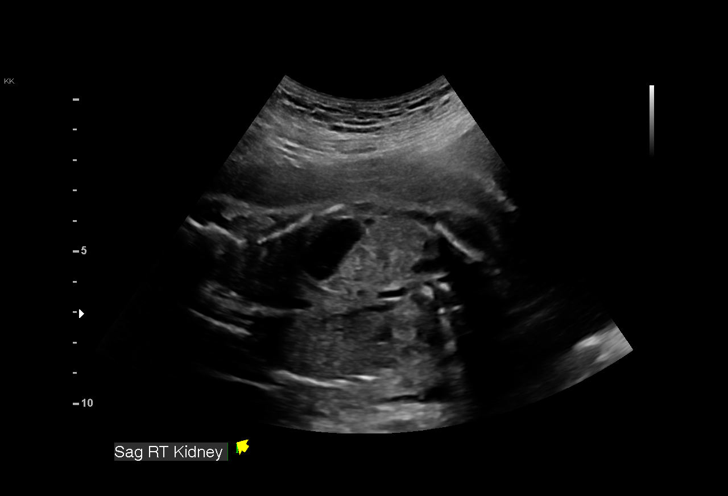
[im 69/72]
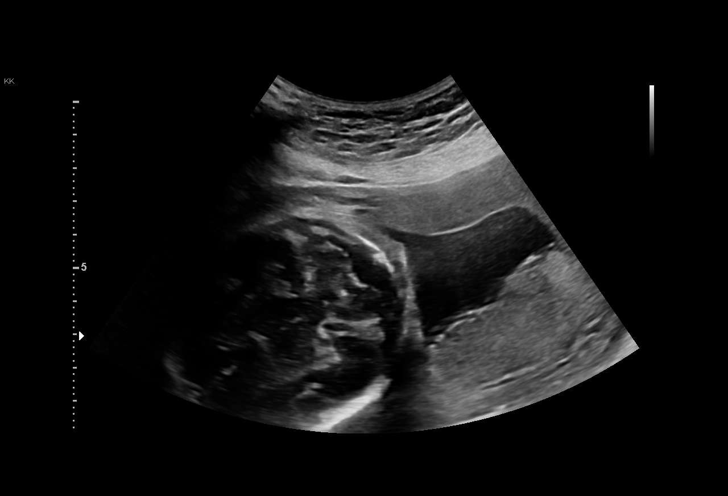

[13 of 28 positions shown; findings below may reference images not displayed]

KAELI NP

Indications

 24 weeks gestation of pregnancy
 Advanced maternal age multigravida 35+,
 second trimester (40 yrs)
 Antenatal follow-up for nonvisualized fetal
 anatomy
Fetal Evaluation

 Num Of Fetuses:         1
 Fetal Heart Rate(bpm):  131
 Cardiac Activity:       Observed
 Presentation:           Variable
 Placenta:               Anterior Fundal
 P. Cord Insertion:      Previously Visualized

 Amniotic Fluid
 AFI FV:      Within normal limits

                             Largest Pocket(cm)

Biometry

 BPD:      62.7  mm     G. Age:  25w 3d         62  %    CI:         74.2   %    70 - 86
                                                         FL/HC:      20.1   %    18.7 -
 HC:      231.1  mm     G. Age:  25w 1d         39  %    HC/AC:      1.10        1.04 -
 AC:      209.6  mm     G. Age:  25w 4d         62  %    FL/BPD:     74.0   %    71 - 87
 FL:       46.4  mm     G. Age:  25w 3d         55  %    FL/AC:      22.1   %    20 - 24
 Est. FW:     810  gm    1 lb 13 oz      67  %
OB History

 Gravidity:    3
 Living:       2
Gestational Age

 LMP:           23w 3d        Date:  01/31/20                 EDD:   11/06/20
 U/S Today:     25w 3d                                        EDD:   10/23/20
 Best:          24w 6d     Det. By:  U/S  (06/17/20)          EDD:   10/27/20
Anatomy

 Cranium:               Appears normal         Aortic Arch:            Previously seen
 Cavum:                 Appears normal         Ductal Arch:            Previously seen
 Ventricles:            Appears normal         Diaphragm:              Appears normal
 Choroid Plexus:        Appears normal         Stomach:                Appears normal, left
                                                                       sided
 Cerebellum:            Appears normal         Abdomen:                Appears normal
 Posterior Fossa:       Previously seen        Abdominal Wall:         Appears nml (cord
                                                                       insert, abd wall)
 Nuchal Fold:           Previously seen        Cord Vessels:           Appears normal (3
                                                                       vessel cord)
 Face:                  Appears normal         Kidneys:                Appear normal
                        (orbits and profile)
 Lips:                  Appears normal         Bladder:                Appears normal
 Thoracic:              Appears normal         Spine:                  Previously seen
 Heart:                 Appears normal         Upper Extremities:      Previously seen
                        (4CH, axis, and
                        situs)
 RVOT:                  Appears normal         Lower Extremities:      Previously seen
 LVOT:                  Appears normal

 Other:  Heels/feet and open hands/5th digits previously visualized. Fetus
         appears to be female.
Cervix Uterus Adnexa

 Cervix
 Length:           3.26  cm.
 Normal appearance by transabdominal scan.
Impression

 Patient returned for completion of fetal anatomy .Amniotic
 fluid is normal and good fetal activity is seen .Fetal biometry
 is consistent with her previously-established dates .Fetal
 anatomical survey was completed and appears normal.

 On cell-free fetal DNA screening, the risks of fetal
 aneuploidies are not increased. I informed the patient of the
 results and gave her a copy.
Recommendations

 -An appointment was made for her to return in 8 weeks for
 fetal growth assessment (AMA).
                 Seget, Edmunt

## 2023-03-02 ENCOUNTER — Encounter: Payer: Self-pay | Admitting: Physical Medicine and Rehabilitation

## 2023-03-02 ENCOUNTER — Ambulatory Visit (INDEPENDENT_AMBULATORY_CARE_PROVIDER_SITE_OTHER): Payer: Medicaid Other | Admitting: Physical Medicine and Rehabilitation

## 2023-03-02 DIAGNOSIS — G8929 Other chronic pain: Secondary | ICD-10-CM

## 2023-03-02 DIAGNOSIS — M5441 Lumbago with sciatica, right side: Secondary | ICD-10-CM | POA: Diagnosis not present

## 2023-03-02 DIAGNOSIS — M25561 Pain in right knee: Secondary | ICD-10-CM | POA: Diagnosis not present

## 2023-03-02 DIAGNOSIS — M25562 Pain in left knee: Secondary | ICD-10-CM | POA: Diagnosis not present

## 2023-03-02 NOTE — Progress Notes (Signed)
Colleen Avila - 43 y.o. female MRN [id number]  Date of birth: 11-05-1980  Office Visit Note: Visit Date: 03/02/2023 PCP: Harlen Labs, NP Referred by: Harlen Labs, NP  Subjective: Chief Complaint  Patient presents with   Lower Back - Pain   HPI: Colleen Avila is a 43 y.o. female who comes in today per the request of Harlen Labs, NP for evaluation of chronic bilateral lower back pain radiating to right anterior thigh down to knee. Spanish interpreter present during our visit today. Pain started several years ago after giving birth, her pain is intermittent. She describes pain as sore and aching, currently denies pain at this time. Some relief of pain with home exercise regimen, rest and use of medications. Overall, she is doing much better at this time. States she has recently lost weight and started exercising. I did place order for physical therapy and lumbar MRI imaging during our office visit. States she never got a call to schedule physical therapy/MRI imaging. Patient denies focal weakness, numbness and tingling. No recent trauma or falls.   Of note, there was a misunderstanding with her appointment today, she is requesting to see orthopedic surgeon for chronic bilateral knee pain.      Review of Systems  Musculoskeletal:  Positive for joint pain. Negative for back pain.  Neurological:  Negative for tingling, sensory change, focal weakness and weakness.  All other systems reviewed and are negative.  Otherwise per HPI.  Assessment & Plan: Visit Diagnoses:    ICD-10-CM   1. Chronic bilateral low back pain with right-sided sciatica  M54.41 Ambulatory referral to Physical Therapy   G89.29     2. Chronic pain of both knees  M25.561    M25.562    G89.29        Plan: Findings:  Chronic bilateral lower back pain radiating to right anterior thigh down to knee. Patient currently denies pain, she continues with home exercise regimen, rest and use of  medications as needed. She is doing much better at this time, she is requesting another referral be placed for formal physical therapy which I will do today. I will also arrange for patient to have bilateral knees evaluated by one of our orthopedic specialists Karenann Cai, PA for further evaluation and treatment. Patient has no questions at this time. We are happy to see her back for lower back issues. No red flag symptoms noted upon exam today.     Meds & Orders: No orders of the defined types were placed in this encounter.   Orders Placed This Encounter  Procedures   Ambulatory referral to Physical Therapy    Follow-up: Return if symptoms worsen or fail to improve.   Procedures: No procedures performed      Clinical History: No specialty comments available.   She reports that she has never smoked. She has never used smokeless tobacco. No results for input(s): "HGBA1C", "LABURIC" in the last 8760 hours.  Objective:  VS:  HT:    WT:   BMI:     BP:   HR: bpm  TEMP: ( )  RESP:  Physical Exam Vitals and nursing note reviewed.  HENT:     Head: Normocephalic and atraumatic.     Right Ear: External ear normal.     Left Ear: External ear normal.     Nose: Nose normal.     Mouth/Throat:     Mouth: Mucous membranes are moist.  Eyes:     Extraocular Movements: Extraocular movements intact.  Cardiovascular:     Rate and Rhythm: Normal rate.     Pulses: Normal pulses.  Pulmonary:     Effort: Pulmonary effort is normal.  Abdominal:     General: Abdomen is flat. There is no distension.  Musculoskeletal:        General: Normal range of motion.     Cervical back: Normal range of motion.     Comments: Patient rises from seated position to standing without difficulty. Good lumbar range of motion. No pain noted with facet loading. 5/5 strength noted with bilateral hip flexion, knee flexion/extension, ankle dorsiflexion/plantarflexion and EHL. No clonus noted bilaterally. No pain upon  palpation of greater trochanters. No pain with internal/external rotation of bilateral hips. Sensation intact bilaterally. Negative slump test bilaterally. Ambulates without aid, gait steady.     Skin:    General: Skin is warm and dry.     Capillary Refill: Capillary refill takes less than 2 seconds.  Neurological:     General: No focal deficit present.     Mental Status: She is alert and oriented to person, place, and time.  Psychiatric:        Mood and Affect: Mood normal.        Behavior: Behavior normal.     Ortho Exam  Imaging: No results found.  Past Medical/Family/Surgical/Social History: Medications & Allergies reviewed per EMR, new medications updated. Patient Active Problem List   Diagnosis Date Noted   Delivery of pregnancy by cesarean section 11/04/2020   Indication for care in labor and delivery, antepartum 11/03/2020   Past Medical History:  Diagnosis Date   Fibroid    History of recurrent UTIs    Family History  Problem Relation Age of Onset   Diabetes Maternal Grandmother    Hypertension Maternal Grandmother    Diabetes Maternal Grandfather    Hypertension Maternal Grandfather    Diabetes Paternal Grandmother    Hypertension Paternal Grandmother    Diabetes Paternal Grandfather    Hypertension Paternal Grandfather    Past Surgical History:  Procedure Laterality Date   CESAREAN SECTION N/A 11/04/2020   Procedure: CESAREAN SECTION;  Surgeon: Catalina Antigua, MD;  Location: [facility];  Service: Obstetrics;  Laterality: N/A;   CHOLECYSTECTOMY     Social History   Occupational History   Not on file  Tobacco Use   Smoking status: Never   Smokeless tobacco: Never  Vaping Use   Vaping status: Never Used  Substance and Sexual Activity   Alcohol use: Never   Drug use: Never   Sexual activity: Not Currently

## 2023-03-15 ENCOUNTER — Ambulatory Visit: Payer: Medicaid Other | Admitting: Surgical

## 2023-03-15 ENCOUNTER — Other Ambulatory Visit (INDEPENDENT_AMBULATORY_CARE_PROVIDER_SITE_OTHER): Payer: Self-pay

## 2023-03-15 ENCOUNTER — Encounter: Payer: Self-pay | Admitting: Surgical

## 2023-03-15 DIAGNOSIS — M25561 Pain in right knee: Secondary | ICD-10-CM

## 2023-03-15 DIAGNOSIS — M25462 Effusion, left knee: Secondary | ICD-10-CM

## 2023-03-15 DIAGNOSIS — G8929 Other chronic pain: Secondary | ICD-10-CM

## 2023-03-15 DIAGNOSIS — M25562 Pain in left knee: Secondary | ICD-10-CM

## 2023-03-15 DIAGNOSIS — M25461 Effusion, right knee: Secondary | ICD-10-CM | POA: Diagnosis not present

## 2023-03-19 ENCOUNTER — Encounter: Payer: Self-pay | Admitting: Surgical

## 2023-03-19 NOTE — Progress Notes (Signed)
 Office Visit Note   Patient: Colleen Avila           Date of Birth: 1980-09-01           MRN: [id number] Visit Date: 03/15/2023 Requested by: Desiree Quale, NP [address] [place],  [place] [address] PCP: Desiree Quale, NP  Subjective: Chief Complaint  Patient presents with   Right Knee - Pain   Left Knee - Pain    HPI: Colleen Avila is a 43 y.o. female who presents to the office reporting bilateral knee pain.  Patient states that she has had knee pain for several years but feels that her pain is getting worse.  She localizes pain primarily to the medial aspect of the knee.  She does note popping on occasion especially with ascending and descending stairs.  She has difficulty kneeling on either knee.  The right knee has caused her significant pain for several years and she feels this started with a fall onto her right knee while she was pregnant with her daughter years ago.  The left knee has bothered her intermittently but has been significantly worse since she went to visit family in [place] and wore high heels and noticed her knee was very swollen.  She does note stiffness with immobility in both knees.  She had no history of prior surgery.  She works education officer, environmental houses.  No history of prior injection but she has had intramuscular diclofenac injection which did not really help her.  No locking symptoms.  She enjoys running but she is holding off on this due to her knee pain.  Has not been able to do this for about 2 years.  No groin pain or radicular pain..                ROS: All systems reviewed are negative as they relate to the chief complaint within the history of present illness.  Patient denies fevers or chills.  Assessment & Plan: Visit Diagnoses:  1. Effusion of right knee   2. Chronic pain of both knees   3. Effusion of left knee     Plan: Patient is a 43 year old female who presents for evaluation of bilateral knee pain.  She has chronic pain that has  been worse in the left knee especially recently.  Radiographs of bilateral knees demonstrate no significant degenerative changes or any acute osseous abnormality.  We discussed options available to patient and with this life altering pain that is getting in the way of her exercising and making work difficult, need for further evaluation of potential meniscal pathology with MRI.  Ordered MRI of the right and left knees with effusion present and medial joint line tenderness.  Follow-up after MRIs to review results.  Follow-Up Instructions: Return for Review CT/MRI scan.   Orders:  Orders Placed This Encounter  Procedures   XR KNEE 3 VIEW LEFT   XR KNEE 3 VIEW RIGHT   MR Knee Right w/o contrast   MR Knee Left w/o contrast   No orders of the defined types were placed in this encounter.     Procedures: No procedures performed   Clinical Data: No additional findings.  Objective: Vital Signs: There were no vitals taken for this visit.  Physical Exam:  Constitutional: Patient appears well-developed HEENT:  Head: Normocephalic Eyes:EOM are normal Neck: Normal range of motion Cardiovascular: Normal rate Pulmonary/chest: Effort normal Neurologic: Patient is alert Skin: Skin is warm Psychiatric: Patient has normal mood and affect  Ortho Exam:  Ortho exam demonstrates bilateral knees with small effusion.  Tenderness over the medial joint line in both knees.  No lateral joint line tenderness in either knee.  No calf tenderness.  Negative Homans' sign.  She is able to perform straight leg raise without extensor lag in either knee.  She has stable knee to anterior and posterior drawer sign bilaterally.  No laxity with varus or valgus stress of either knee.  No pain with hip range of motion bilaterally.  Palpable DP pulse bilaterally.  No cellulitis or skin changes of either knee.  Specialty Comments:  No specialty comments available.  Imaging: No results found.   PMFS History: Patient  Active Problem List   Diagnosis Date Noted   Delivery of pregnancy by cesarean section 11/04/2020   Indication for care in labor and delivery, antepartum 11/03/2020   Past Medical History:  Diagnosis Date   Fibroid    History of recurrent UTIs     Family History  Problem Relation Age of Onset   Diabetes Maternal Grandmother    Hypertension Maternal Grandmother    Diabetes Maternal Grandfather    Hypertension Maternal Grandfather    Diabetes Paternal Grandmother    Hypertension Paternal Grandmother    Diabetes Paternal Grandfather    Hypertension Paternal Grandfather     Past Surgical History:  Procedure Laterality Date   CESAREAN SECTION N/A 11/04/2020   Procedure: CESAREAN SECTION;  Surgeon: Alger Gong, MD;  Location: [facility];  Service: Obstetrics;  Laterality: N/A;   CHOLECYSTECTOMY     Social History   Occupational History   Not on file  Tobacco Use   Smoking status: Never   Smokeless tobacco: Never  Vaping Use   Vaping status: Never Used  Substance and Sexual Activity   Alcohol use: Never   Drug use: Never   Sexual activity: Not Currently

## 2023-04-11 ENCOUNTER — Encounter (HOSPITAL_COMMUNITY): Payer: Self-pay

## 2023-04-11 ENCOUNTER — Ambulatory Visit (HOSPITAL_COMMUNITY)
Admission: EM | Admit: 2023-04-11 | Discharge: 2023-04-11 | Disposition: A | Discharge status: Home / Self Care | Attending: Physician Assistant | Admitting: Physician Assistant

## 2023-04-11 DIAGNOSIS — R112 Nausea with vomiting, unspecified: Secondary | ICD-10-CM

## 2023-04-11 DIAGNOSIS — R197 Diarrhea, unspecified: Secondary | ICD-10-CM | POA: Diagnosis not present

## 2023-04-11 DIAGNOSIS — R42 Dizziness and giddiness: Secondary | ICD-10-CM

## 2023-04-11 LAB — POC COVID19/FLU A&B COMBO
Covid Antigen, POC: NEGATIVE
Influenza A Antigen, POC: NEGATIVE
Influenza B Antigen, POC: NEGATIVE

## 2023-04-11 MED ORDER — MECLIZINE HCL 25 MG PO TABS: 25.0000 mg | ORAL_TABLET | Freq: Three times a day (TID) | ORAL | 0 refills | Status: AC | PRN

## 2023-04-11 MED ORDER — ONDANSETRON 4 MG PO TBDP: 4.0000 mg | ORAL_TABLET | Freq: Three times a day (TID) | ORAL | 0 refills | Status: AC | PRN

## 2023-04-11 MED ORDER — ONDANSETRON 4 MG PO TBDP
ORAL_TABLET | ORAL | Status: AC
  Filled 2023-04-11: qty 1

## 2023-04-11 MED ORDER — ONDANSETRON 4 MG PO TBDP
4.0000 mg | ORAL_TABLET | Freq: Once | ORAL | Status: AC
  Administered 2023-04-11: 4 mg via ORAL

## 2023-04-11 NOTE — Discharge Instructions (Addendum)
 At this time I suspect that your symptoms were likely caused by potential food poisoning.  Usually viral GI illness typically starts to affect others within the household and since her symptoms are limited to just herself I suspect that this is likely the cause.  I have sent in a medication called meclizine for you to take to help with your dizziness.  You can take this up to every 8 hours but please note that it can make you drowsy so do not take it if you need to remain alert or drive. I am also sending in a medication called Zofran for you to take to help with the nausea and vomiting.  Please note that this can cause constipation so please be aware if this happens that you may need to use a stool softener or a few doses of MiraLAX to help with bowel movements. Please make sure that you are focusing on consuming plenty of water and fluids.  It is important to prevent dehydration when you are having nausea and diarrhea. I recommend consuming a bland diet for the next few days until you are feeling better.  As tolerated you can slowly start to reincorporate your normal diet. If at any point you start to have nausea, vomiting, diarrhea to the point that you feel like you are getting dehydrated, you start to have trouble breathing, fevers that are not responding to over-the-counter medications please go to the emergency room for further evaluation management.  If you are continuing to have the same symptoms for longer than a week please follow-up with your primary care provider for ongoing management.  En este momento sospecho que sus sntomas probablemente fueron causados por una posible intoxicacin alimentaria.  Por lo general, la enfermedad gastrointestinal viral generalmente comienza a afectar a Control and instrumentation engineer del hogar y, dado que sus sntomas se limitan solo a ella misma, sospecho que esta es probablemente la causa.  Le he enviado un medicamento llamado meclizina para que lo tome y Chief Executive Officer ayude con los  [place].  Puede tomar esto hasta cada 8 horas, pero tenga en cuenta que puede causarle sueo, as que no lo tome si necesita Wellsite geologist o conducir. Tambin le enviar un medicamento llamado Zofran para que lo tome y le ayude con las nuseas y los vmitos.  Tenga en cuenta que esto puede causar estreimiento, as que tenga en cuenta que si esto sucede, es posible que necesite usar un ablandador de heces o algunas dosis de MiraLAX para ayudar con las deposiciones. Asegrese de concentrarse en consumir mucha agua y lquidos.  Es importante prevenir la deshidratacin cuando tiene nuseas y Segundo. Recomiendo consumir una dieta [employer] prximos [address] que te sientas mejor.  Segn lo tolere podr poco a Development worker, community a Database administrator dieta normal. Si en algn momento comienza a tener nuseas, vmitos, diarrea hasta el punto de sentir que se est deshidratando, comienza a tener problemas para respirar, fiebres que no responden a los medicamentos de venta [place], acuda a la sala de emergencias para un manejo de evaluacin adicional.  Si contina teniendo los mismos sntomas durante ms de [place], consulte con su proveedor de atencin primaria para un control continuo.

## 2023-04-11 NOTE — ED Provider Notes (Signed)
 [facility]    CSN: [id number] Arrival date & time: 04/11/23  1023      History   Chief Complaint Chief Complaint  Patient presents with   Dizziness   Emesis    HPI Ludivina Risenhoover is a 43 y.o. female.   HPI  She is here with a companion who is assisting with Spanish translation They state she started to have dizziness, nausea since last night She started to have vomiting this AM - has vomited clear saliva and green mucus She reports she is having severe dizziness that is aggravated with head movement and position changes   They deny recent sick contacts or similar symptoms in others in household Interventions: nothing   She did consume a yogurt yesterday that had been open for over day and she is worried this may be causing her symptoms      Past Medical History:  Diagnosis Date   Fibroid    History of recurrent UTIs     Patient Active Problem List   Diagnosis Date Noted   Delivery of pregnancy by cesarean section 11/04/2020   Indication for care in labor and delivery, antepartum 11/03/2020    Past Surgical History:  Procedure Laterality Date   CESAREAN SECTION N/A 11/04/2020   Procedure: CESAREAN SECTION;  Surgeon: Catalina Antigua, MD;  Location: [facility];  Service: Obstetrics;  Laterality: N/A;   CHOLECYSTECTOMY      OB History     Gravida  3   Para  3   Term  3   Preterm      AB      Living  3      SAB      IAB      Ectopic      Multiple  0   Live Births  1            Home Medications    Prior to Admission medications   Medication Sig Start Date End Date Taking? Authorizing Provider  meclizine (ANTIVERT) 25 MG tablet Take 1 tablet (25 mg total) by mouth 3 (three) times daily as needed for dizziness. 04/11/23  Yes Rhoderick Farrel E, PA-C  ondansetron (ZOFRAN-ODT) 4 MG disintegrating tablet Take 1 tablet (4 mg total) by mouth every 8 (eight) hours as needed for nausea or vomiting. 04/11/23  Yes Jeany Seville E, PA-C   acetaminophen (TYLENOL) 325 MG tablet Take 650 mg by mouth every 6 (six) hours as needed.    [provider]  ibuprofen (ADVIL) 600 MG tablet Take 1 tablet (600 mg total) by mouth every 6 (six) hours as needed. 11/06/20   Allayne Stack, DO  meloxicam (MOBIC) 15 MG tablet Take 1 tablet (15 mg total) by mouth daily. 10/25/22 10/25/23  Juanda Chance, NP  montelukast (SINGULAIR) 10 MG tablet Take 10 mg by mouth daily. 09/16/22   [provider]  senna-docusate (SENOKOT-S) 8.6-50 MG tablet Take 2 tablets by mouth daily. 11/06/20   Allayne Stack, DO  triamcinolone cream (KENALOG) 0.1 % Apply topically. 09/16/22   [provider]    Family History Family History  Problem Relation Age of Onset   Diabetes Maternal Grandmother    Hypertension Maternal Grandmother    Diabetes Maternal Grandfather    Hypertension Maternal Grandfather    Diabetes Paternal Grandmother    Hypertension Paternal Grandmother    Diabetes Paternal Grandfather    Hypertension Paternal Grandfather     Social History Social History  Tobacco Use   Smoking status: Never   Smokeless tobacco: Never  Vaping Use   Vaping status: Never Used  Substance Use Topics   Alcohol use: Never   Drug use: Never     Allergies   Patient has no known allergies.   Review of Systems Review of Systems  Constitutional:  Positive for chills and fatigue. Negative for fever.  HENT:  Negative for ear pain.   Respiratory:  Negative for cough, choking and shortness of breath.   Gastrointestinal:  Positive for diarrhea (this AM), nausea and vomiting. Negative for abdominal pain and blood in stool.  Neurological:  Positive for dizziness.     Physical Exam Triage Vital Signs ED Triage Vitals [04/11/23 1118]  Encounter Vitals Group     BP 129/84     Systolic BP Percentile      Diastolic BP Percentile      Pulse Rate (!) 58     Resp 18     Temp 97.6 F (36.4 C)     Temp Source Oral     SpO2 97 %      Weight      Height      Head Circumference      Peak Flow      Pain Score 0     Pain Loc      Pain Education      Exclude from Growth Chart    No data found.  Updated Vital Signs BP 129/84 (BP Location: Left Arm)   Pulse (!) 58   Temp 97.6 F (36.4 C) (Oral)   Resp 18   LMP 04/01/2023   SpO2 97%   Breastfeeding No   Visual Acuity Right Eye Distance:   Left Eye Distance:   Bilateral Distance:    Right Eye Near:   Left Eye Near:    Bilateral Near:     Physical Exam Vitals reviewed.  Constitutional:      General: She is awake.     Appearance: Normal appearance. She is well-developed and well-groomed.  HENT:     Head: Normocephalic and atraumatic.     Right Ear: Hearing, tympanic membrane and ear canal normal.     Left Ear: Hearing, tympanic membrane and ear canal normal.     Mouth/Throat:     Lips: Pink.     Mouth: Mucous membranes are moist.     Pharynx: Oropharynx is clear. Uvula midline. No pharyngeal swelling, oropharyngeal exudate, posterior oropharyngeal erythema, uvula swelling or postnasal drip.  Cardiovascular:     Rate and Rhythm: Normal rate and regular rhythm.     Heart sounds: Normal heart sounds. No murmur heard.    No friction rub. No gallop.  Pulmonary:     Effort: Pulmonary effort is normal.     Breath sounds: Normal breath sounds. No decreased air movement. No decreased breath sounds, wheezing, rhonchi or rales.  Abdominal:     General: Abdomen is flat. Bowel sounds are normal.     Palpations: Abdomen is soft.     Tenderness: There is no abdominal tenderness. There is no guarding or rebound. Negative signs include McBurney's sign.     Hernia: There is no hernia in the umbilical area or ventral area.  Neurological:     General: No focal deficit present.     Mental Status: She is alert and oriented to person, place, and time.     GCS: GCS eye subscore is 4. GCS verbal subscore is 5. GCS motor  subscore is 6.     Cranial Nerves: No cranial  nerve deficit, dysarthria or facial asymmetry.  Psychiatric:        Attention and Perception: Attention and perception normal.        Mood and Affect: Mood and affect normal.        Speech: Speech normal.        Behavior: Behavior normal. Behavior is cooperative.      UC Treatments / Results  Labs (all labs ordered are listed, but only abnormal results are displayed) Labs Reviewed  POC COVID19/FLU A&B COMBO    EKG   Radiology No results found.  Procedures Procedures (including critical care time)  Medications Ordered in UC Medications  ondansetron (ZOFRAN-ODT) disintegrating tablet 4 mg (4 mg Oral Given 04/11/23 1219)    Initial Impression / Assessment and Plan / UC Course  I have reviewed the triage vital signs and the nursing notes.  Pertinent labs & imaging results that were available during my care of the patient were reviewed by me and considered in my medical decision making (see chart for details).     Patient able to tolerate p.o. liquid intake after Zofran administration.  I myself witnessed her drink about half a cup of water without notable coughing, vomiting.  Final Clinical Impressions(s) / UC Diagnoses   Final diagnoses:  Dizziness  Nausea vomiting and diarrhea   Patient presents today with concerns for acute onset of dizziness, nausea, vomiting and single episode of diarrhea.  Physical exam is overall reassuring today.  Patient did start to improve after administration of Zofran in the clinic.  At this time I suspect that she may have likely food poisoning given recent ingestion of old yogurt.  She and her companion denies similar symptoms in the rest of the household which lessens my concern for viral etiology but cannot completely rule this out at this time.  Recommend symptomatic management at this time.  Will send in meclizine 25 mg p.o. 3 times daily as needed.  Reviewed that this can be sedating and she should not take when she needs to remain alert or  drive.  Will also send in Zofran to assist with nausea and vomiting and to improve p.o. intake.  Reviewed that this can cause some constipation so she should be aware of this as she takes it.  Recommend focusing on hydration efforts for now and slowly incorporating small portions of a bland diet as tolerated.  Reviewed that after few days if she is feeling better she can slowly start to reincorporate her normal diet if tolerated.  ED and return precautions were reviewed and provided in after visit summary.  Follow-up as needed for progressing or persistent symptoms    Discharge Instructions      At this time I suspect that your symptoms were likely caused by potential food poisoning.  Usually viral GI illness typically starts to affect others within the household and since her symptoms are limited to just herself I suspect that this is likely the cause.  I have sent in a medication called meclizine for you to take to help with your dizziness.  You can take this up to every 8 hours but please note that it can make you drowsy so do not take it if you need to remain alert or drive. I am also sending in a medication called Zofran for you to take to help with the nausea and vomiting.  Please note that this can cause constipation so  please be aware if this happens that you may need to use a stool softener or a few doses of MiraLAX to help with bowel movements. Please make sure that you are focusing on consuming plenty of water and fluids.  It is important to prevent dehydration when you are having nausea and diarrhea. I recommend consuming a bland diet for the next few days until you are feeling better.  As tolerated you can slowly start to reincorporate your normal diet. If at any point you start to have nausea, vomiting, diarrhea to the point that you feel like you are getting dehydrated, you start to have trouble breathing, fevers that are not responding to over-the-counter medications please go to the  emergency room for further evaluation management.  If you are continuing to have the same symptoms for longer than a week please follow-up with your primary care provider for ongoing management.  En este momento sospecho que sus sntomas probablemente fueron causados por una posible intoxicacin alimentaria.  Por lo general, la enfermedad gastrointestinal viral generalmente comienza a afectar a Control and instrumentation engineer del hogar y, dado que sus sntomas se limitan solo a ella misma, sospecho que esta es probablemente la causa.  Le he enviado un medicamento llamado meclizina para que lo tome y Chief Executive Officer ayude con los [place].  Puede tomar esto hasta cada 8 horas, pero tenga en cuenta que puede causarle sueo, as que no lo tome si necesita Wellsite geologist o conducir. Tambin le enviar un medicamento llamado Zofran para que lo tome y le ayude con las nuseas y los vmitos.  Tenga en cuenta que esto puede causar estreimiento, as que tenga en cuenta que si esto sucede, es posible que necesite usar un ablandador de heces o algunas dosis de MiraLAX para ayudar con las deposiciones. Asegrese de concentrarse en consumir mucha agua y lquidos.  Es importante prevenir la deshidratacin cuando tiene nuseas y [place]. Recomiendo consumir una dieta [employer] prximos [address] que te sientas mejor.  Segn lo tolere podr poco a Development worker, community a Database administrator dieta normal. Si en algn momento comienza a tener nuseas, vmitos, diarrea hasta el punto de sentir que se est deshidratando, comienza a tener problemas para respirar, fiebres que no responden a los medicamentos de venta [place], acuda a la sala de emergencias para un manejo de evaluacin adicional.  Si contina teniendo los mismos sntomas durante ms de [place], consulte con su proveedor de atencin primaria para un control continuo.     ED Prescriptions     Medication Sig Dispense Auth. Provider   ondansetron (ZOFRAN-ODT) 4 MG disintegrating tablet  Take 1 tablet (4 mg total) by mouth every 8 (eight) hours as needed for nausea or vomiting. 20 tablet Darlisa Spruiell E, PA-C   meclizine (ANTIVERT) 25 MG tablet Take 1 tablet (25 mg total) by mouth 3 (three) times daily as needed for dizziness. 30 tablet Jude Linck E, PA-C      PDMP not reviewed this encounter.   Providence Crosby, PA-C 04/11/23 1342

## 2023-04-11 NOTE — ED Triage Notes (Signed)
 Pt c/o dizziness and n/v since 3am this morning. States vomiting green mucus. Denies taking meds for sx's. Vomit x8 since 8am.

## 2023-04-21 ENCOUNTER — Telehealth: Payer: Self-pay | Admitting: Radiology

## 2023-04-21 NOTE — Telephone Encounter (Signed)
 Patient is on the scheduled to see [facility] Monday afternoon, she's only seen [facility].  Looks like she seen Luke on 03/15/23 MRI was ordered & was to follow up. I checked on MRI seems like they were authorized but order was canceled, not sure why.   Please advise.

## 2023-04-21 NOTE — Telephone Encounter (Signed)
 I can see that the imaging facility has tried numerous times to contact the patient to schedule an appointment, without success.

## 2023-04-24 ENCOUNTER — Ambulatory Visit: Admitting: Physician Assistant

## 2023-05-08 ENCOUNTER — Ambulatory Visit (INDEPENDENT_AMBULATORY_CARE_PROVIDER_SITE_OTHER): Admitting: Physician Assistant

## 2023-05-08 ENCOUNTER — Encounter: Payer: Self-pay | Admitting: Physician Assistant

## 2023-05-08 DIAGNOSIS — M25562 Pain in left knee: Secondary | ICD-10-CM | POA: Diagnosis not present

## 2023-05-08 DIAGNOSIS — M25462 Effusion, left knee: Secondary | ICD-10-CM | POA: Diagnosis not present

## 2023-05-08 DIAGNOSIS — G8929 Other chronic pain: Secondary | ICD-10-CM

## 2023-05-08 DIAGNOSIS — M25561 Pain in right knee: Secondary | ICD-10-CM

## 2023-05-08 NOTE — Progress Notes (Signed)
 HPI: Colleen Avila 43 year old female who comes in today for right knee pain.  She was seen in February by Karenann Cai PA-C for bilateral knee pain and MRI was ordered of both knees and attempts were made to schedule the patient for the MRIs.  However they were unable to contact her.  She comes back in today With continued bilateral knee pain.  She notes that her knee pain is becoming worse since December.  She notes that her knee swelled after walking up and down stairs or dancing.  She takes ibuprofen which helps.  She was seen in [place] back early intra muscular diclofenac injection.  She has never had injections on either knee.  She notes that the left knee gives way.  She notes popping both knees but no painful popping no locking or catching of either knee.  No injury to either knee.  Left knee pain is worse than the right.  She feels a stabbing-like sensation in both knees medial aspect at times.  Patient presents today with an interpreter she speaks [place]. Radiographs bilateral knees 3 views dated 03/15/2023 are reviewed personally.  They showed no acute fractures.  Knees are well located and well-preserved.   Review of systems: See HPI otherwise negative.  Physical exam: General: Well-developed well-nourished female no acute distress mood and affect appropriate.   Psych: Alert and oriented x 3 Skin: Bilateral knees no abnormal warmth erythema or bruising.  No rashes skin lesions. Bilateral knees: Good range of motion both knees.  Slight patellofemoral crepitus bilaterally.  Mild effusion left knee.  Tenderness along the medial joint line of both knees and tenderness along the lateral joint line of the right knee.  McMurray's is negative.  No instability valgus varus stressing of either knee.  Anterior drawer is negative bilaterally.  Calf supple nontender bilaterally  Impression: Bilateral knee pain  Plan: Given patient's continued bilateral knee pain and effusion left knee recommend MRI of both knees  to evaluate for meniscal tears.  Have her follow-up after the MRI to go over results and discuss further treatment.  Questions were encouraged and answered at length.

## 2023-05-10 ENCOUNTER — Other Ambulatory Visit: Payer: Self-pay | Admitting: Radiology

## 2023-05-10 DIAGNOSIS — G8929 Other chronic pain: Secondary | ICD-10-CM

## 2023-05-10 DIAGNOSIS — M25461 Effusion, right knee: Secondary | ICD-10-CM

## 2023-05-10 DIAGNOSIS — M25462 Effusion, left knee: Secondary | ICD-10-CM

## 2023-06-04 ENCOUNTER — Ambulatory Visit
Admission: RE | Admit: 2023-06-04 | Discharge: 2023-06-04 | Disposition: A | Discharge status: Home / Self Care | Source: Ambulatory Visit | Attending: Physician Assistant | Admitting: Physician Assistant

## 2023-06-04 DIAGNOSIS — G8929 Other chronic pain: Secondary | ICD-10-CM

## 2023-06-04 DIAGNOSIS — M25461 Effusion, right knee: Secondary | ICD-10-CM

## 2023-06-04 DIAGNOSIS — M25462 Effusion, left knee: Secondary | ICD-10-CM

## 2023-06-19 ENCOUNTER — Ambulatory Visit: Admitting: Physician Assistant

## 2023-06-19 ENCOUNTER — Encounter: Payer: Self-pay | Admitting: Physician Assistant

## 2023-06-19 DIAGNOSIS — G8929 Other chronic pain: Secondary | ICD-10-CM

## 2023-06-19 DIAGNOSIS — M25561 Pain in right knee: Secondary | ICD-10-CM

## 2023-06-19 DIAGNOSIS — M25562 Pain in left knee: Secondary | ICD-10-CM

## 2023-06-19 NOTE — Progress Notes (Signed)
 HPI: Colleen Avila returns today to go over the MRI of both knees.  She has had no change in overall pain in both knees she continues to take ibuprofen .  Left knee pain is worse than the right. MRI results reviewed with the patient.  MRI of the left knee left knee shows no arthrofibrosis bony lesions meniscal tears or ligamentous injury. Right knee MRI shows trace edema deep to the iliotibial band.  Otherwise unremarkable.  Physical exam: General Well-developed well-nourished female who ambulates without any assistive device. Bilateral knees: Good range of motion of both knees.  Patellofemoral crepitus.  Tenderness peripatellar region bilateral knees and medial joint line.  No tenderness over the pes anserinus.  No abnormal warmth erythema or effusion of either knee.  Nontender over the IT bands bilaterally particularly at the insertion.  Impression: Bilateral knee pain  Plan: Recommended cortisone injection for both therapeutic and diagnostic purposes left knee patient deferred.  Therefore we will send her to formal therapy for quad strengthening.  Reviewed quad strengthening with the patient.  Questions were encouraged and answered today using interpreter.  Follow-up with Dr. Alvester Avila in 6 weeks.

## 2023-06-20 ENCOUNTER — Other Ambulatory Visit: Payer: Self-pay

## 2023-06-20 DIAGNOSIS — M25462 Effusion, left knee: Secondary | ICD-10-CM

## 2023-06-20 DIAGNOSIS — G8929 Other chronic pain: Secondary | ICD-10-CM

## 2023-06-20 DIAGNOSIS — M25461 Effusion, right knee: Secondary | ICD-10-CM

## 2023-07-17 NOTE — Therapy (Deleted)
 OUTPATIENT PHYSICAL THERAPY LOWER EXTREMITY EVALUATION   Patient Name: Colleen Avila MRN: [id number] DOB:Nov 18, 1980, 43 y.o., female Today's Date: 07/17/2023  END OF SESSION:   Past Medical History:  Diagnosis Date   Fibroid    History of recurrent UTIs    Past Surgical History:  Procedure Laterality Date   CESAREAN SECTION N/A 11/04/2020   Procedure: CESAREAN SECTION;  Surgeon: Verlyn Goad, MD;  Location: [facility];  Service: Obstetrics;  Laterality: N/A;   CHOLECYSTECTOMY     Patient Active Problem List   Diagnosis Date Noted   Delivery of pregnancy by cesarean section 11/04/2020   Indication for care in labor and delivery, antepartum 11/03/2020    PCP: Anda Bamberg, NP   REFERRING PROVIDER: Bronson Canny, PA-C  REFERRING DIAG: M25.561,M25.562,G89.29 (ICD-10-CM) - Chronic pain of both knees M25.462 (ICD-10-CM) - Effusion of left knee M25.461 (ICD-10-CM) - Effusion of right knee  THERAPY DIAG:  No diagnosis found.  Rationale for Evaluation and Treatment: Rehabilitation  ONSET DATE: chronic  SUBJECTIVE:   SUBJECTIVE STATEMENT: ***  PERTINENT HISTORY: Colleen Avila returns today to go over the MRI of both knees.  She has had no change in overall pain in both knees she continues to take ibuprofen .  Left knee pain is worse than the right. MRI results reviewed with the patient.  MRI of the left knee left knee shows no arthrofibrosis bony lesions meniscal tears or ligamentous injury. Right knee MRI shows trace edema deep to the iliotibial band.  Otherwise unremarkable. PAIN:  Are you having pain? Yes: NPRS scale: *** Pain location: *** Pain description: *** Aggravating factors: *** Relieving factors: ***  PRECAUTIONS: None  RED FLAGS: None   WEIGHT BEARING RESTRICTIONS: No  FALLS:  Has patient fallen in last 6 months? {fallsyesno:27318}  OCCUPATION: ***  PLOF: Independent  PATIENT GOALS: To manage my knee pain  NEXT MD VISIT:  TBD  OBJECTIVE:  Note: Objective measures were completed at Evaluation unless otherwise noted.  DIAGNOSTIC FINDINGS: IMPRESSION: Unremarkable MRI of the left knee.   Electronically signed by: Adrien Alberta MD 06/05/2023 08:58 AM EDT RP Workstation: [id number]  IMPRESSION: No accelerated arthrosis or acute abnormality.   Trace edema deep to the iliotibial band. Correlation for mild iliotibial band syndrome.   Otherwise, unremarkable MRI of the right knee.   Electronically signed by: Adrien Alberta MD 06/05/2023 09:04 AM EDT RP Workstation: [id number]  PATIENT SURVEYS:  LEFS  Extreme difficulty/unable (0), Quite a bit of difficulty (1), Moderate difficulty (2), Little difficulty (3), No difficulty (4) Survey date:    Any of your usual work, housework or school activities   2. Usual hobbies, recreational or sporting activities   3. Getting into/out of the bath   4. Walking between rooms   5. Putting on socks/shoes   6. Squatting    7. Lifting an object, like a bag of groceries from the floor   8. Performing light activities around your home   9. Performing heavy activities around your home   10. Getting into/out of a car   11. Walking 2 blocks   12. Walking 1 mile   13. Going up/down 10 stairs (1 flight)   14. Standing for 1 hour   15.  sitting for 1 hour   16. Running on even ground   17. Running on uneven ground   18. Making sharp turns while running fast   19. Hopping    20. Rolling over in bed   Score total:  ***  MUSCLE LENGTH: Hamstrings: Right *** deg; Left *** deg Andy Bannister test: Right *** deg; Left *** deg  POSTURE: {posture:25561}  PALPATION: ***  LOWER EXTREMITY ROM:  {AROM/PROM:27142} ROM Right eval Left eval  Hip flexion    Hip extension    Hip abduction    Hip adduction    Hip internal rotation    Hip external rotation    Knee flexion    Knee extension    Ankle dorsiflexion    Ankle plantarflexion    Ankle inversion    Ankle  eversion     (Blank rows = not tested)  LOWER EXTREMITY MMT:  MMT Right eval Left eval  Hip flexion    Hip extension    Hip abduction    Hip adduction    Hip internal rotation    Hip external rotation    Knee flexion    Knee extension    Ankle dorsiflexion    Ankle plantarflexion    Ankle inversion    Ankle eversion     (Blank rows = not tested)  LOWER EXTREMITY SPECIAL TESTS:  {LEspecialtests:26242}  FUNCTIONAL TESTS:  30 seconds chair stand test  GAIT: Distance walked: 100ftx2 Assistive device utilized: None Level of assistance: Complete Independence Comments: ***                                                                                                                                TREATMENT DATE: ***    PATIENT EDUCATION:  Education details: Discussed eval findings, rehab rationale and POC and patient is in agreement  Person educated: Patient Education method: Explanation and Handouts Education comprehension: verbalized understanding and needs further education  HOME EXERCISE PROGRAM: ***  ASSESSMENT:  CLINICAL IMPRESSION: Patient is a *** y.o. *** who was seen today for physical therapy evaluation and treatment for ***.   OBJECTIVE IMPAIRMENTS: {opptimpairments:25111}.   ACTIVITY LIMITATIONS: {activitylimitations:27494}  PARTICIPATION LIMITATIONS: {participationrestrictions:25113}  PERSONAL FACTORS: {Personal factors:25162} are also affecting patient's functional outcome.   REHAB POTENTIAL: Good  CLINICAL DECISION MAKING: Stable/uncomplicated  EVALUATION COMPLEXITY: Low   GOALS: Goals reviewed with patient? No  SHORT TERM GOALS: Target date: *** Patient to demonstrate independence in HEP Baseline: Goal status: INITIAL  2.  *** Baseline:  Goal status: INITIAL  3.  *** Baseline:  Goal status: INITIAL  4.  *** Baseline:  Goal status: INITIAL  5.  *** Baseline:  Goal status: INITIAL  6.  *** Baseline:  Goal status:  INITIAL  LONG TERM GOALS: Target date: ***  Patient will increase 30s chair stand reps from *** to *** with/without arms to demonstrate and improved functional ability with less pain/difficulty as well as reduce fall risk. Baseline:  Goal status: INITIAL  2.  Patient will score at least ***% on FOTO to signify clinically meaningful improvement in functional abilities.   Baseline:  Goal status: INITIAL  3.  Patient will acknowledge ***/10 pain at least once during episode  of care   Baseline:  Goal status: INITIAL  4.  *** Baseline:  Goal status: INITIAL  5.  *** Baseline:  Goal status: INITIAL  6.  *** Baseline:  Goal status: INITIAL   PLAN:  PT FREQUENCY: 1-2x/week  PT DURATION: 6 weeks  PLANNED INTERVENTIONS: 97110-Therapeutic exercises, 97530- Therapeutic activity, [id number]- Neuromuscular re-education, 97535- Self Care, 29562- Manual therapy, and 97116- Gait training  PLAN FOR NEXT SESSION: HEP review and update, manual techniques as appropriate, aerobic tasks, ROM and flexibility activities, strengthening and PREs, TPDN, gait and balance training as needed     Eldon Greenland, PT 07/17/2023, 12:21 PM

## 2023-07-18 ENCOUNTER — Ambulatory Visit: Attending: Physician Assistant

## 2023-07-31 ENCOUNTER — Ambulatory Visit: Admitting: Orthopaedic Surgery

## 2023-08-29 ENCOUNTER — Other Ambulatory Visit: Payer: Self-pay

## 2023-08-29 ENCOUNTER — Ambulatory Visit: Attending: Physician Assistant

## 2023-08-29 DIAGNOSIS — G8929 Other chronic pain: Secondary | ICD-10-CM | POA: Insufficient documentation

## 2023-08-29 DIAGNOSIS — M25562 Pain in left knee: Secondary | ICD-10-CM | POA: Insufficient documentation

## 2023-08-29 DIAGNOSIS — R262 Difficulty in walking, not elsewhere classified: Secondary | ICD-10-CM | POA: Diagnosis present

## 2023-08-29 DIAGNOSIS — R600 Localized edema: Secondary | ICD-10-CM | POA: Diagnosis present

## 2023-08-29 DIAGNOSIS — M25561 Pain in right knee: Secondary | ICD-10-CM | POA: Diagnosis present

## 2023-08-29 NOTE — Therapy (Signed)
 OUTPATIENT PHYSICAL THERAPY LOWER EXTREMITY EVALUATION   Patient Name: Colleen Avila MRN: [id number] DOB:Nov 12, 1980, 43 y.o., female Today's Date: 08/29/2023  END OF SESSION:  Visit Number 1 Number of Visits 16 Date for PT re-eval 10/24/2023 Authorization Type [employer] PT start time 1702 PT stop time 1740 PT time calculation (min) 38 min      Past Medical History:  Diagnosis Date   Fibroid    History of recurrent UTIs    Past Surgical History:  Procedure Laterality Date   CESAREAN SECTION N/A 11/04/2020   Procedure: CESAREAN SECTION;  Surgeon: Alger Gong, MD;  Location: [facility] LD ORS;  Service: Obstetrics;  Laterality: N/A;   CHOLECYSTECTOMY     Patient Active Problem List   Diagnosis Date Noted   Delivery of pregnancy by cesarean section 11/04/2020   Indication for care in labor and delivery, antepartum 11/03/2020    PCP: Desiree Quale, NP   REFERRING PROVIDER:  Gretta Bertrum ORN, PA-C  Eval and Treat bilateral knees  Quad strengthening, modalities, HEP   REFERRING DIAG:  M25.561,M25.562,G89.29 (ICD-10-CM) - Chronic pain of both knees  M25.462 (ICD-10-CM) - Effusion of left knee  M25.461 (ICD-10-CM) - Effusion of right knee    THERAPY DIAG:  Chronic pain of both knees  Localized edema  Difficulty in walking, not elsewhere classified  Rationale for Evaluation and Treatment: Rehabilitation  ONSET DATE: 06/20/2023 date of referral   SUBJECTIVE:   SUBJECTIVE STATEMENT:  Patient reports that she has been having more pain in her knees since December 2024. Her knee pain initially began when she fell on her R knee when she was pregnant with her 41 year old, then on both knees after she had the baby. She states that she feels its continuing because of carrying her 41 year old a lot, possibly with poor posture. She states that the other day, she was dancing while holding her daughter and she had an awful pain, and knee swelling. She states that  she needed the diclofenac injections. She states that the doctor said that they could take some fluid off of the knee, but I want to try Physical Therapy first. She also has knee stiffness and locking with walking. She would like to get back to normal activities, because now she is gaining weight.   PERTINENT HISTORY: Relevant PMHx includes Fibroids  PAIN:  Are you having pain?  Yes: NPRS scale: 3/10 current, 10/10 at worst Pain location: BIL knee pain  Pain description: cracking Aggravating factors: walking, standing, dancing  Relieving factors: rest   PRECAUTIONS: None  RED FLAGS: None   WEIGHT BEARING RESTRICTIONS: No  FALLS:  Has patient fallen in last 6 months? No  LIVING ENVIRONMENT: Lives with: lives with their family Lives in: House/apartment Stairs: Yes: Internal: 18-20 steps; can reach both and External: 4 steps; can reach both Has following equipment at home: None  OCCUPATION: [employer]   Some pain with pumping (foot pedal) some times   PLOF: Independent  PATIENT GOALS: to be able to walk normally and without pain; also return to exercising, running  NEXT MD VISIT: 12/27/2023 Neurology   OBJECTIVE:  Note: Objective measures were completed at Evaluation unless otherwise noted.  DIAGNOSTIC FINDINGS:   06/05/2023: BIL Knee MRI   IMPRESSION: Unremarkable MRI of the left knee.  IMPRESSION: No accelerated arthrosis or acute abnormality.   Trace edema deep to the iliotibial band. Correlation for mild iliotibial band syndrome.   Otherwise, unremarkable MRI of the right knee.  PATIENT SURVEYS:  LEFS   Extreme difficulty/unable (0), Quite a bit of difficulty (1), Moderate difficulty (2), Little difficulty (3), No difficulty (4) Survey date:  08/29/2023  Any of your usual work, housework or school activities 3  2. Usual hobbies, recreational or sporting activities 2  3. Getting into/out of the bath 2  4. Walking between rooms 4  5.  Putting on socks/shoes 4  6. Squatting  3  7. Lifting an object, like a bag of groceries from the floor 3  8. Performing light activities around your home 3  9. Performing heavy activities around your home 2  10. Getting into/out of a car 2  11. Walking 2 blocks 3  12. Walking 1 mile 3  13. Going up/down 10 stairs (1 flight) 1  14. Standing for 1 hour 3  15.  sitting for 1 hour 3  16. Running on even ground 2  17. Running on uneven ground 1  18. Making sharp turns while running fast 2  19. Hopping  1  20. Rolling over in bed 2  Score total:  49/80     COGNITION: Overall cognitive status: Within functional limits for tasks assessed     LOWER EXTREMITY ROM:  Active ROM Right eval Left eval  Hip flexion    Hip extension    Hip abduction    Hip adduction    Hip internal rotation    Hip external rotation    Knee flexion 115 112  Knee extension 0 0  Ankle dorsiflexion    Ankle plantarflexion    Ankle inversion    Ankle eversion     (Blank rows = not tested)  LOWER EXTREMITY MMT:  MMT Right eval Left eval  Hip flexion 4+ 4+  Hip extension    Hip abduction 4+ 4+  Hip adduction    Hip internal rotation 4 4+  Hip external rotation 4+ 3+  Knee flexion 4+ 4+  Knee extension 4 4  Ankle dorsiflexion 4+ 4+  Ankle plantarflexion    Ankle inversion    Ankle eversion     (Blank rows = not tested)   FUNCTIONAL TESTS:  30 seconds chair stand test: 11 repetitions                                                                                                                                  TREATMENT DATE:   [facility] Adult PT Treatment:                                                DATE: 08/29/2023  Initial evaluation: see patient education and home exercise program as noted below      PATIENT EDUCATION:  Education details: reviewed initial home exercise program; discussion of POC, prognosis and goals for skilled  PT   Person educated: Patient Education method:  Explanation, Demonstration, and Handouts Education comprehension: verbalized understanding, returned demonstration, and needs further education  HOME EXERCISE PROGRAM: Access Code: [email or web address] URL: [email or web address] Date: 08/29/2023 Prepared by: Marko Molt  Exercises - Seated Long Arc Quad  - 1 x daily - 7 x weekly - 2 sets - 10 reps - 3 sec hold - Clamshell  - 1 x daily - 7 x weekly - 2 sets - 10 reps - Supine Bridge  - 1 x daily - 7 x weekly - 2 sets - 10 reps - 3 sec hold  ASSESSMENT:  CLINICAL IMPRESSION: Zeah is a 43 y.o. female who was seen today for physical therapy evaluation and treatment for persistent BIL knee pain with mobility deficits. She is demonstrating decreased knee flexion AROM BIL, and decreased BIL LE MMT scores. She has related pain and difficulty with prolonged standing, prolonged walking, dancing with her family and performing moderate-to-heavy household chores. She requires skilled PT services at this time to address relevant deficits and improve overall function.     OBJECTIVE IMPAIRMENTS: decreased activity tolerance, decreased ROM, decreased strength, improper body mechanics, and pain.   ACTIVITY LIMITATIONS: carrying, lifting, standing, squatting, sleeping, stairs, locomotion level, and caring for others  PARTICIPATION LIMITATIONS: meal prep, cleaning, laundry, interpersonal relationship, driving, shopping, community activity, and occupation  PERSONAL FACTORS: Time since onset of injury/illness/exacerbation and 1 comorbidity: Fibroids are also affecting patient's functional outcome.   REHAB POTENTIAL: Fair    CLINICAL DECISION MAKING: Stable/uncomplicated  EVALUATION COMPLEXITY: Low   GOALS: Goals reviewed with patient? YES  SHORT TERM GOALS: Target date: 09/26/2023    Patient will be independent with initial home program at least 3 days/week.  Baseline: provided at eval Goal Status: INITIAL   2.  Patient will demonstrate  improved BIL LE MMT scores to at least 4+/5 in all motions measured Baseline: see objective measures Goal Status: INITIAL   3.  Patient will be able to perform at least 13 STS in 30 seconds Baseline: 11 Goal status: INITIAL   LONG TERM GOALS: Target date: 10/24/2023   Patient will report improved overall functional ability with LEFS score of 65/80 or greater.  Baseline: 49/80 Goal Status: INITIAL    2.  Patient will demonstrate ability to navigate uneven surfaces and obstacles with no more than minimal knee pain.  Baseline: unable to tolerate Goal status: INITIAL  3.  Patient will be able to walk for at least 20 minutes without knee locking  Goal status: INITIAL  4.  Patient will report confidence with returning to independent normal exercise routine and recreational activity to address weight management goals. Baseline: unable to tolerate these activities  Goal status: INITIAL     PLAN:  PT FREQUENCY: 1-2x/week  PT DURATION: 8 weeks  PLANNED INTERVENTIONS: 97164- PT Re-evaluation, 97750- Physical Performance Testing, 97110-Therapeutic exercises, 97530- Therapeutic activity, [id number]- Neuromuscular re-education, 97535- Self Care, 02859- Manual therapy, [phone]- Aquatic Therapy, G0283- Electrical stimulation (unattended), [phone]- Ionotophoresis 4mg /ml Dexamethasone , Patient/Family education, Balance training, Taping, Joint mobilization, Cryotherapy, and Moist heat  PLAN FOR NEXT SESSION: address knee AROM/AAROM, LQ strengthening program, weight bearing tolerance, TKE   Marko Molt, PT, DPT  09/04/2023 4:38 PM

## 2023-09-12 ENCOUNTER — Ambulatory Visit

## 2023-09-12 NOTE — Therapy (Incomplete)
 OUTPATIENT PHYSICAL THERAPY LOWER EXTREMITY EVALUATION   Patient Name: Colleen Avila MRN: [id number] DOB:11-15-1980, 43 y.o., female Today's Date: 09/12/2023  END OF SESSION:  Visit Number 1 Number of Visits 16 Date for PT re-eval 10/24/2023 Authorization Type [employer] PT start time 1702 PT stop time 1740 PT time calculation (min) 38 min      Past Medical History:  Diagnosis Date   Fibroid    History of recurrent UTIs    Past Surgical History:  Procedure Laterality Date   CESAREAN SECTION N/A 11/04/2020   Procedure: CESAREAN SECTION;  Surgeon: Alger Gong, MD;  Location: [facility] LD ORS;  Service: Obstetrics;  Laterality: N/A;   CHOLECYSTECTOMY     Patient Active Problem List   Diagnosis Date Noted   Delivery of pregnancy by cesarean section 11/04/2020   Indication for care in labor and delivery, antepartum 11/03/2020    PCP: Desiree Quale, NP   REFERRING PROVIDER:  Gretta Bertrum ORN, PA-C  Eval and Treat bilateral knees  Quad strengthening, modalities, HEP   REFERRING DIAG:  M25.561,M25.562,G89.29 (ICD-10-CM) - Chronic pain of both knees  M25.462 (ICD-10-CM) - Effusion of left knee  M25.461 (ICD-10-CM) - Effusion of right knee    THERAPY DIAG:  No diagnosis found.  Rationale for Evaluation and Treatment: Rehabilitation  ONSET DATE: 06/20/2023 date of referral   SUBJECTIVE:   SUBJECTIVE STATEMENT:  09/12/2023 ***   EVAL: Patient reports that she has been having more pain in her knees since December 2024. Her knee pain initially began when she fell on her R knee when she was pregnant with her 41 year old, then on both knees after she had the baby. She states that she feels its continuing because of carrying her 41 year old a lot, possibly with poor posture. She states that the other day, she was dancing while holding her daughter and she had an awful pain, and knee swelling. She states that she needed the diclofenac injections. She states that  the doctor said that they could take some fluid off of the knee, but I want to try Physical Therapy first. She also has knee stiffness and locking with walking. She would like to get back to normal activities, because now she is gaining weight.   PERTINENT HISTORY: Relevant PMHx includes Fibroids  PAIN:  Are you having pain?  Yes: NPRS scale: 3/10 current, 10/10 at worst Pain location: BIL knee pain  Pain description: cracking Aggravating factors: walking, standing, dancing  Relieving factors: rest   PRECAUTIONS: None  RED FLAGS: None   WEIGHT BEARING RESTRICTIONS: No  FALLS:  Has patient fallen in last 6 months? No  LIVING ENVIRONMENT: Lives with: lives with their family Lives in: House/apartment Stairs: Yes: Internal: 18-20 steps; can reach both and External: 4 steps; can reach both Has following equipment at home: None  OCCUPATION: [employer]   Some pain with pumping (foot pedal) some times   PLOF: Independent  PATIENT GOALS: to be able to walk normally and without pain; also return to exercising, running  NEXT MD VISIT: 12/27/2023 Neurology   OBJECTIVE:  Note: Objective measures were completed at Evaluation unless otherwise noted.  DIAGNOSTIC FINDINGS:   06/05/2023: BIL Knee MRI   IMPRESSION: Unremarkable MRI of the left knee.  IMPRESSION: No accelerated arthrosis or acute abnormality.   Trace edema deep to the iliotibial band. Correlation for mild iliotibial band syndrome.   Otherwise, unremarkable MRI of the right knee.  PATIENT SURVEYS:  LEFS  Extreme difficulty/unable (0), Quite a bit of difficulty (1), Moderate difficulty (2), Little difficulty (3), No difficulty (4) Survey date:  08/29/2023  Any of your usual work, housework or school activities 3  2. Usual hobbies, recreational or sporting activities 2  3. Getting into/out of the bath 2  4. Walking between rooms 4  5. Putting on socks/shoes 4  6. Squatting  3  7. Lifting an  object, like a bag of groceries from the floor 3  8. Performing light activities around your home 3  9. Performing heavy activities around your home 2  10. Getting into/out of a car 2  11. Walking 2 blocks 3  12. Walking 1 mile 3  13. Going up/down 10 stairs (1 flight) 1  14. Standing for 1 hour 3  15.  sitting for 1 hour 3  16. Running on even ground 2  17. Running on uneven ground 1  18. Making sharp turns while running fast 2  19. Hopping  1  20. Rolling over in bed 2  Score total:  49/80     COGNITION: Overall cognitive status: Within functional limits for tasks assessed     LOWER EXTREMITY ROM:  Active ROM Right eval Left eval  Hip flexion    Hip extension    Hip abduction    Hip adduction    Hip internal rotation    Hip external rotation    Knee flexion 115 112  Knee extension 0 0  Ankle dorsiflexion    Ankle plantarflexion    Ankle inversion    Ankle eversion     (Blank rows = not tested)  LOWER EXTREMITY MMT:  MMT Right eval Left eval  Hip flexion 4+ 4+  Hip extension    Hip abduction 4+ 4+  Hip adduction    Hip internal rotation 4 4+  Hip external rotation 4+ 3+  Knee flexion 4+ 4+  Knee extension 4 4  Ankle dorsiflexion 4+ 4+  Ankle plantarflexion    Ankle inversion    Ankle eversion     (Blank rows = not tested)   FUNCTIONAL TESTS:  30 seconds chair stand test: 11 repetitions                                                                                                                                  TREATMENT DATE:   [facility] Adult PT Treatment:                                                DATE: 09/12/2023    Therapeutic Exercise: Nustep or Recumbent Bike for AAROM at start of session Clamshell  Seated heel slide Manual Therapy: *** Neuromuscular re-ed: LAQ with towel roll Standing TKE with red TB Supine Bridge Therapeutic Activity: Lateral weight shifting Staggered  weight shift (airex?) Modalities: *** Self  Care: ***   [facility] Adult PT Treatment:                                                DATE: 08/29/2023  Initial evaluation: see patient education and home exercise program as noted below      PATIENT EDUCATION:  Education details: reviewed initial home exercise program; discussion of POC, prognosis and goals for skilled PT   Person educated: Patient Education method: Explanation, Demonstration, and Handouts Education comprehension: verbalized understanding, returned demonstration, and needs further education  HOME EXERCISE PROGRAM: Access Code: [email or web address] URL: [email or web address] Date: 08/29/2023 Prepared by: Marko Molt  Exercises - Seated Long Arc Quad  - 1 x daily - 7 x weekly - 2 sets - 10 reps - 3 sec hold - Clamshell  - 1 x daily - 7 x weekly - 2 sets - 10 reps - Supine Bridge  - 1 x daily - 7 x weekly - 2 sets - 10 reps - 3 sec hold  ASSESSMENT:  CLINICAL IMPRESSION: Colleen Avila is a 43 y.o. female who was seen today for physical therapy evaluation and treatment for persistent BIL knee pain with mobility deficits. She is demonstrating decreased knee flexion AROM BIL, and decreased BIL LE MMT scores. She has related pain and difficulty with prolonged standing, prolonged walking, dancing with her family and performing moderate-to-heavy household chores. She requires skilled PT services at this time to address relevant deficits and improve overall function.     OBJECTIVE IMPAIRMENTS: decreased activity tolerance, decreased ROM, decreased strength, improper body mechanics, and pain.   ACTIVITY LIMITATIONS: carrying, lifting, standing, squatting, sleeping, stairs, locomotion level, and caring for others  PARTICIPATION LIMITATIONS: meal prep, cleaning, laundry, interpersonal relationship, driving, shopping, community activity, and occupation  PERSONAL FACTORS: Time since onset of injury/illness/exacerbation and 1 comorbidity: Fibroids are also affecting patient's  functional outcome.   REHAB POTENTIAL: Fair    CLINICAL DECISION MAKING: Stable/uncomplicated  EVALUATION COMPLEXITY: Low   GOALS: Goals reviewed with patient? YES  SHORT TERM GOALS: Target date: 09/26/2023    Patient will be independent with initial home program at least 3 days/week.  Baseline: provided at eval Goal Status: INITIAL   2.  Patient will demonstrate improved BIL LE MMT scores to at least 4+/5 in all motions measured Baseline: see objective measures Goal Status: INITIAL   3.  Patient will be able to perform at least 13 STS in 30 seconds Baseline: 11 Goal status: INITIAL   LONG TERM GOALS: Target date: 10/24/2023   Patient will report improved overall functional ability with LEFS score of 65/80 or greater.  Baseline: 49/80 Goal Status: INITIAL    2.  Patient will demonstrate ability to navigate uneven surfaces and obstacles with no more than minimal knee pain.  Baseline: unable to tolerate Goal status: INITIAL  3.  Patient will be able to walk for at least 20 minutes without knee locking  Goal status: INITIAL  4.  Patient will report confidence with returning to independent normal exercise routine and recreational activity to address weight management goals. Baseline: unable to tolerate these activities  Goal status: INITIAL     PLAN:  PT FREQUENCY: 1-2x/week  PT DURATION: 8 weeks  PLANNED INTERVENTIONS: 97164- PT Re-evaluation, 97750- Physical Performance Testing, 97110-Therapeutic exercises, 97530- Therapeutic activity, [id number]- Neuromuscular re-education, 97535- Self Care,  02859- Manual therapy, [id number]- Aquatic Therapy, [phone]- Electrical stimulation (unattended), [phone]- Ionotophoresis 4mg /ml Dexamethasone , Patient/Family education, Balance training, Taping, Joint mobilization, Cryotherapy, and Moist heat  PLAN FOR NEXT SESSION: address knee AROM/AAROM, LQ strengthening program, weight bearing tolerance, TKE   Marko Molt, PT, DPT  09/12/2023  11:04 AM

## 2023-09-13 ENCOUNTER — Ambulatory Visit

## 2023-09-19 ENCOUNTER — Ambulatory Visit

## 2023-09-20 ENCOUNTER — Ambulatory Visit

## 2023-09-26 ENCOUNTER — Ambulatory Visit: Attending: Student

## 2023-09-26 DIAGNOSIS — M25561 Pain in right knee: Secondary | ICD-10-CM | POA: Insufficient documentation

## 2023-09-26 DIAGNOSIS — R262 Difficulty in walking, not elsewhere classified: Secondary | ICD-10-CM | POA: Insufficient documentation

## 2023-09-26 DIAGNOSIS — R600 Localized edema: Secondary | ICD-10-CM | POA: Diagnosis present

## 2023-09-26 DIAGNOSIS — M25562 Pain in left knee: Secondary | ICD-10-CM | POA: Insufficient documentation

## 2023-09-26 DIAGNOSIS — G8929 Other chronic pain: Secondary | ICD-10-CM | POA: Insufficient documentation

## 2023-09-26 NOTE — Therapy (Signed)
 OUTPATIENT PHYSICAL THERAPY NOTE   Patient Name: Colleen Avila MRN: [id number] DOB:[birth year], 43 y.o., female Today's Date: 09/26/2023  END OF SESSION:  PT End of Session - 09/26/23 1747     Visit Number 2    Number of Visits 16    Date for PT Re-Evaluation 10/24/23    Authorization Type [employer]    PT Start Time 1748    PT Stop Time 1824    PT Time Calculation (min) 36 min    Activity Tolerance Patient tolerated treatment well    Behavior During Therapy WFL for tasks assessed/performed          Past Medical History:  Diagnosis Date   Fibroid    History of recurrent UTIs    Past Surgical History:  Procedure Laterality Date   CESAREAN SECTION N/A 11/04/2020   Procedure: CESAREAN SECTION;  Surgeon: Alger Gong, MD;  Location: [facility] LD ORS;  Service: Obstetrics;  Laterality: N/A;   CHOLECYSTECTOMY     Patient Active Problem List   Diagnosis Date Noted   Delivery of pregnancy by cesarean section 11/04/2020   Indication for care in labor and delivery, antepartum 11/03/2020    PCP: Desiree Quale, NP   REFERRING PROVIDER:  Gretta Bertrum ORN, PA-C  Eval and Treat bilateral knees  Quad strengthening, modalities, HEP   REFERRING DIAG:  M25.561,M25.562,G89.29 (ICD-10-CM) - Chronic pain of both knees  M25.462 (ICD-10-CM) - Effusion of left knee  M25.461 (ICD-10-CM) - Effusion of right knee    THERAPY DIAG:  Chronic pain of both knees  Localized edema  Difficulty in walking, not elsewhere classified  Rationale for Evaluation and Treatment: Rehabilitation  ONSET DATE: 06/20/2023 date of referral   SUBJECTIVE:   SUBJECTIVE STATEMENT:  09/26/2023 Patient reports that her pain is the same as initial eval on 08/29/23. She has been out of town and continues to notice a lot of pain with stairs. She has not been working on [employer].  EVAL: Patient reports that she has been having more pain in her knees since December 2024. Her knee pain initially began  when she fell on her R knee when she was pregnant with her 41 year old, then on both knees after she had the baby. She states that she feels its continuing because of carrying her 41 year old a lot, possibly with poor posture. She states that the other day, she was dancing while holding her daughter and she had an awful pain, and knee swelling. She states that she needed the diclofenac injections. She states that the doctor said that they could take some fluid off of the knee, but I want to try Physical Therapy first. She also has knee stiffness and locking with walking. She would like to get back to normal activities, because now she is gaining weight.   PERTINENT HISTORY: Relevant PMHx includes Fibroids  PAIN:  Are you having pain?  Yes: NPRS scale: 3/10 current, 10/10 at worst Pain location: BIL knee pain  Pain description: cracking Aggravating factors: walking, standing, dancing  Relieving factors: rest   PRECAUTIONS: None  RED FLAGS: None   WEIGHT BEARING RESTRICTIONS: No  FALLS:  Has patient fallen in last 6 months? No  LIVING ENVIRONMENT: Lives with: lives with their family Lives in: House/apartment Stairs: Yes: Internal: 18-20 steps; can reach both and External: 4 steps; can reach both Has following equipment at home: None  OCCUPATION: [employer]   Some pain with pumping (foot pedal) some times  PLOF: Independent  PATIENT GOALS: to be able to walk normally and without pain; also return to exercising, running  NEXT MD VISIT: 12/27/2023 Neurology   OBJECTIVE:  Note: Objective measures were completed at Evaluation unless otherwise noted.  DIAGNOSTIC FINDINGS:   06/05/2023: BIL Knee MRI   IMPRESSION: Unremarkable MRI of the left knee.  IMPRESSION: No accelerated arthrosis or acute abnormality.   Trace edema deep to the iliotibial band. Correlation for mild iliotibial band syndrome.   Otherwise, unremarkable MRI of the right knee.  PATIENT  SURVEYS:  LEFS   Extreme difficulty/unable (0), Quite a bit of difficulty (1), Moderate difficulty (2), Little difficulty (3), No difficulty (4) Survey date:  08/29/2023  Any of your usual work, housework or school activities 3  2. Usual hobbies, recreational or sporting activities 2  3. Getting into/out of the bath 2  4. Walking between rooms 4  5. Putting on socks/shoes 4  6. Squatting  3  7. Lifting an object, like a bag of groceries from the floor 3  8. Performing light activities around your home 3  9. Performing heavy activities around your home 2  10. Getting into/out of a car 2  11. Walking 2 blocks 3  12. Walking 1 mile 3  13. Going up/down 10 stairs (1 flight) 1  14. Standing for 1 hour 3  15.  sitting for 1 hour 3  16. Running on even ground 2  17. Running on uneven ground 1  18. Making sharp turns while running fast 2  19. Hopping  1  20. Rolling over in bed 2  Score total:  49/80     COGNITION: Overall cognitive status: Within functional limits for tasks assessed     LOWER EXTREMITY ROM:  Active ROM Right eval Left eval  Hip flexion    Hip extension    Hip abduction    Hip adduction    Hip internal rotation    Hip external rotation    Knee flexion 115 112  Knee extension 0 0  Ankle dorsiflexion    Ankle plantarflexion    Ankle inversion    Ankle eversion     (Blank rows = not tested)  LOWER EXTREMITY MMT:  MMT Right eval Left eval  Hip flexion 4+ 4+  Hip extension    Hip abduction 4+ 4+  Hip adduction    Hip internal rotation 4 4+  Hip external rotation 4+ 3+  Knee flexion 4+ 4+  Knee extension 4 4  Ankle dorsiflexion 4+ 4+  Ankle plantarflexion    Ankle inversion    Ankle eversion     (Blank rows = not tested)   FUNCTIONAL TESTS:  30 seconds chair stand test: 11 repetitions                                                                                                                                  TREATMENT DATE:  [facility]  Adult PT Treatment:                                                DATE: 09/26/2023  Therapeutic Exercise: NuStep level 4, x 5 minutes  Clamshell 2 x 10 each  Seated heel slide 2 x 10 each  Updated HEP and reviewed with patient  Neuromuscular re-ed: Supine Bridge 2 x 10  LAQ with towel roll under thigh x 20 each  Standing TKE with blue TB, 2 x 10 each      [facility] Adult PT Treatment:                                                DATE: 08/29/2023  Initial evaluation: see patient education and home exercise program as noted below      PATIENT EDUCATION:  Education details: reviewed initial home exercise program; discussion of POC, prognosis and goals for skilled PT   Person educated: Patient Education method: Explanation, Demonstration, and Handouts Education comprehension: verbalized understanding, returned demonstration, and needs further education  HOME EXERCISE PROGRAM: Access Code: [email or web address] URL: [email or web address] Date: 09/26/2023 Prepared by: Marko Molt  Exercises - Seated Heel Slide  - 1 x daily - 7 x weekly - 2 sets - 10 reps - 3 sec hold - Seated Long Arc Quad  - 1 x daily - 7 x weekly - 2 sets - 10 reps - 3 sec hold - Clamshell  - 1 x daily - 7 x weekly - 2 sets - 10 reps - Supine Bridge  - 1 x daily - 7 x weekly - 2 sets - 10 reps - 3 sec hold  ASSESSMENT:  CLINICAL IMPRESSION:  09/26/2023: Terita had good tolerance of initial treatment session, reporting decreased pain at end of session. Tactile and Verbal cues required for instruction. Patient reports exercise-related muscle pain with S/L clamshells. Updated and reviewed HEP with patient, including importance of adherence to HEP as prescribed. Patient requires ongoing skilled PT intervention to address current impairments and related functional deficits. We will continue to progress as tolerated.      EVAL: Fatou is a 43 y.o. female who was seen today for physical therapy evaluation and  treatment for persistent BIL knee pain with mobility deficits. She is demonstrating decreased knee flexion AROM BIL, and decreased BIL LE MMT scores. She has related pain and difficulty with prolonged standing, prolonged walking, dancing with her family and performing moderate-to-heavy household chores. She requires skilled PT services at this time to address relevant deficits and improve overall function.     OBJECTIVE IMPAIRMENTS: decreased activity tolerance, decreased ROM, decreased strength, improper body mechanics, and pain.   ACTIVITY LIMITATIONS: carrying, lifting, standing, squatting, sleeping, stairs, locomotion level, and caring for others  PARTICIPATION LIMITATIONS: meal prep, cleaning, laundry, interpersonal relationship, driving, shopping, community activity, and occupation  PERSONAL FACTORS: Time since onset of injury/illness/exacerbation and 1 comorbidity: Fibroids are also affecting patient's functional outcome.   REHAB POTENTIAL: Fair    CLINICAL DECISION MAKING: Stable/uncomplicated  EVALUATION COMPLEXITY: Low   GOALS: Goals reviewed with patient? YES  SHORT TERM GOALS: Target date: 09/26/2023    Patient will be independent with initial home program at least 3 days/week.  Baseline: provided  at eval Goal Status: INITIAL   2.  Patient will demonstrate improved BIL LE MMT scores to at least 4+/5 in all motions measured Baseline: see objective measures Goal Status: INITIAL   3.  Patient will be able to perform at least 13 STS in 30 seconds Baseline: 11 Goal status: INITIAL   LONG TERM GOALS: Target date: 10/24/2023   Patient will report improved overall functional ability with LEFS score of 65/80 or greater.  Baseline: 49/80 Goal Status: INITIAL    2.  Patient will demonstrate ability to navigate uneven surfaces and obstacles with no more than minimal knee pain.  Baseline: unable to tolerate Goal status: INITIAL  3.  Patient will be able to walk for at  least 20 minutes without knee locking  Goal status: INITIAL  4.  Patient will report confidence with returning to independent normal exercise routine and recreational activity to address weight management goals. Baseline: unable to tolerate these activities  Goal status: INITIAL     PLAN:  PT FREQUENCY: 1-2x/week  PT DURATION: 8 weeks  PLANNED INTERVENTIONS: 97164- PT Re-evaluation, 97750- Physical Performance Testing, 97110-Therapeutic exercises, 97530- Therapeutic activity, [id number]- Neuromuscular re-education, 97535- Self Care, 02859- Manual therapy, [phone]- Aquatic Therapy, G0283- Electrical stimulation (unattended), [phone]- Ionotophoresis 4mg /ml Dexamethasone , Patient/Family education, Balance training, Taping, Joint mobilization, Cryotherapy, and Moist heat  PLAN FOR NEXT SESSION: address knee AROM/AAROM, LQ strengthening program, weight bearing tolerance, TKE   Marko Molt, PT, DPT  09/26/2023 6:34 PM

## 2023-09-28 ENCOUNTER — Ambulatory Visit

## 2023-09-28 DIAGNOSIS — R262 Difficulty in walking, not elsewhere classified: Secondary | ICD-10-CM

## 2023-09-28 DIAGNOSIS — R600 Localized edema: Secondary | ICD-10-CM

## 2023-09-28 DIAGNOSIS — G8929 Other chronic pain: Secondary | ICD-10-CM

## 2023-09-28 DIAGNOSIS — M25561 Pain in right knee: Secondary | ICD-10-CM | POA: Diagnosis not present

## 2023-09-28 NOTE — Therapy (Signed)
 OUTPATIENT PHYSICAL THERAPY NOTE   Patient Name: Colleen Avila MRN: [id number] DOB:07-28-1980, 43 y.o., female Today's Date: 09/28/2023  END OF SESSION:    Past Medical History:  Diagnosis Date   Fibroid    History of recurrent UTIs    Past Surgical History:  Procedure Laterality Date   CESAREAN SECTION N/A 11/04/2020   Procedure: CESAREAN SECTION;  Surgeon: Alger Gong, MD;  Location: [facility] LD ORS;  Service: Obstetrics;  Laterality: N/A;   CHOLECYSTECTOMY     Patient Active Problem List   Diagnosis Date Noted   Delivery of pregnancy by cesarean section 11/04/2020   Indication for care in labor and delivery, antepartum 11/03/2020    PCP: Desiree Quale, NP   REFERRING PROVIDER:  Gretta Bertrum ORN, PA-C  Eval and Treat bilateral knees  Quad strengthening, modalities, HEP   REFERRING DIAG:  M25.561,M25.562,G89.29 (ICD-10-CM) - Chronic pain of both knees  M25.462 (ICD-10-CM) - Effusion of left knee  M25.461 (ICD-10-CM) - Effusion of right knee    THERAPY DIAG:  Chronic pain of both knees  Localized edema  Difficulty in walking, not elsewhere classified  Rationale for Evaluation and Treatment: Rehabilitation  ONSET DATE: 06/20/2023 date of referral   SUBJECTIVE:   SUBJECTIVE STATEMENT:  09/28/2023 Patient reports that her pain has decreased since last visit. She has been consistent with HEP.   EVAL: Patient reports that she has been having more pain in her knees since December 2024. Her knee pain initially began when she fell on her R knee when she was pregnant with her 41 year old, then on both knees after she had the baby. She states that she feels its continuing because of carrying her 41 year old a lot, possibly with poor posture. She states that the other day, she was dancing while holding her daughter and she had an awful pain, and knee swelling. She states that she needed the diclofenac injections. She states that the doctor said that they could  take some fluid off of the knee, but I want to try Physical Therapy first. She also has knee stiffness and locking with walking. She would like to get back to normal activities, because now she is gaining weight.   PERTINENT HISTORY: Relevant PMHx includes Fibroids  PAIN:  Are you having pain?  Yes: NPRS scale: 3/10 current, 10/10 at worst Pain location: BIL knee pain  Pain description: cracking Aggravating factors: walking, standing, dancing  Relieving factors: rest   PRECAUTIONS: None  RED FLAGS: None   WEIGHT BEARING RESTRICTIONS: No  FALLS:  Has patient fallen in last 6 months? No  LIVING ENVIRONMENT: Lives with: lives with their family Lives in: House/apartment Stairs: Yes: Internal: 18-20 steps; can reach both and External: 4 steps; can reach both Has following equipment at home: None  OCCUPATION: [employer]   Some pain with pumping (foot pedal) some times   PLOF: Independent  PATIENT GOALS: to be able to walk normally and without pain; also return to exercising, running  NEXT MD VISIT: 12/27/2023 Neurology   OBJECTIVE:  Note: Objective measures were completed at Evaluation unless otherwise noted.  DIAGNOSTIC FINDINGS:   06/05/2023: BIL Knee MRI   IMPRESSION: Unremarkable MRI of the left knee.  IMPRESSION: No accelerated arthrosis or acute abnormality.   Trace edema deep to the iliotibial band. Correlation for mild iliotibial band syndrome.   Otherwise, unremarkable MRI of the right knee.  PATIENT SURVEYS:  LEFS   Extreme difficulty/unable (0), Quite a bit of difficulty (  1), Moderate difficulty (2), Little difficulty (3), No difficulty (4) Survey date:  08/29/2023  Any of your usual work, housework or school activities 3  2. Usual hobbies, recreational or sporting activities 2  3. Getting into/out of the bath 2  4. Walking between rooms 4  5. Putting on socks/shoes 4  6. Squatting  3  7. Lifting an object, like a bag of groceries  from the floor 3  8. Performing light activities around your home 3  9. Performing heavy activities around your home 2  10. Getting into/out of a car 2  11. Walking 2 blocks 3  12. Walking 1 mile 3  13. Going up/down 10 stairs (1 flight) 1  14. Standing for 1 hour 3  15.  sitting for 1 hour 3  16. Running on even ground 2  17. Running on uneven ground 1  18. Making sharp turns while running fast 2  19. Hopping  1  20. Rolling over in bed 2  Score total:  49/80     COGNITION: Overall cognitive status: Within functional limits for tasks assessed     LOWER EXTREMITY ROM:  Active ROM Right eval Left eval  Hip flexion    Hip extension    Hip abduction    Hip adduction    Hip internal rotation    Hip external rotation    Knee flexion 115 112  Knee extension 0 0  Ankle dorsiflexion    Ankle plantarflexion    Ankle inversion    Ankle eversion     (Blank rows = not tested)  LOWER EXTREMITY MMT:  MMT Right eval Left eval  Hip flexion 4+ 4+  Hip extension    Hip abduction 4+ 4+  Hip adduction    Hip internal rotation 4 4+  Hip external rotation 4+ 3+  Knee flexion 4+ 4+  Knee extension 4 4  Ankle dorsiflexion 4+ 4+  Ankle plantarflexion    Ankle inversion    Ankle eversion     (Blank rows = not tested)   FUNCTIONAL TESTS:  30 seconds chair stand test: 11 repetitions                                                                                                                                  TREATMENT DATE:    [facility] Adult PT Treatment:                                                DATE: 09/28/2023  Therapeutic Exercise: NuStep level 4, x 5 minutes  Clamshell 2 x 10 each  Seated heel slide 2 x 10 each, 3 sec hold  Updated and reviewed HEP   Neuromuscular re-ed: Supine Bridge x 20  LAQ with towel roll under thigh x 20 each  Standing TKE with blue TB, 2 x 10 each        PATIENT EDUCATION:  Education details: reviewed initial home exercise  program; discussion of POC, prognosis and goals for skilled PT   Person educated: Patient Education method: Explanation, Demonstration, and Handouts Education comprehension: verbalized understanding, returned demonstration, and needs further education  HOME EXERCISE PROGRAM: Access Code: [email or web address] URL: [email or web address] Date: 09/26/2023 Prepared by: Marko Molt  Exercises - Seated Heel Slide  - 1 x daily - 7 x weekly - 2 sets - 10 reps - 3 sec hold - Seated Long Arc Quad  - 1 x daily - 7 x weekly - 2 sets - 10 reps - 3 sec hold - Clamshell  - 1 x daily - 7 x weekly - 2 sets - 10 reps - Supine Bridge  - 1 x daily - 7 x weekly - 2 sets - 10 reps - 3 sec hold  ASSESSMENT:  CLINICAL IMPRESSION:  09/28/2023: Zakyla had good tolerance of today's treatment session, which focused on LE strengthening program and NuStep for AAROM and pain modulation. Patient had less pain with strengthening activities. We will continue to progress per POC as tolerated, in order to reach established rehab goals.      EVAL: Hurley is a 43 y.o. female who was seen today for physical therapy evaluation and treatment for persistent BIL knee pain with mobility deficits. She is demonstrating decreased knee flexion AROM BIL, and decreased BIL LE MMT scores. She has related pain and difficulty with prolonged standing, prolonged walking, dancing with her family and performing moderate-to-heavy household chores. She requires skilled PT services at this time to address relevant deficits and improve overall function.     OBJECTIVE IMPAIRMENTS: decreased activity tolerance, decreased ROM, decreased strength, improper body mechanics, and pain.   ACTIVITY LIMITATIONS: carrying, lifting, standing, squatting, sleeping, stairs, locomotion level, and caring for others  PARTICIPATION LIMITATIONS: meal prep, cleaning, laundry, interpersonal relationship, driving, shopping, community activity, and  occupation  PERSONAL FACTORS: Time since onset of injury/illness/exacerbation and 1 comorbidity: Fibroids are also affecting patient's functional outcome.   REHAB POTENTIAL: Fair    CLINICAL DECISION MAKING: Stable/uncomplicated  EVALUATION COMPLEXITY: Low   GOALS: Goals reviewed with patient? YES  SHORT TERM GOALS: Target date: 09/26/2023   Patient will be independent with initial home program at least 3 days/week.  Baseline: provided at eval Goal Status: INITIAL   2.  Patient will demonstrate improved BIL LE MMT scores to at least 4+/5 in all motions measured Baseline: see objective measures Goal Status: INITIAL   3.  Patient will be able to perform at least 13 STS in 30 seconds Baseline: 11 Goal status: INITIAL   LONG TERM GOALS: Target date: 10/24/2023   Patient will report improved overall functional ability with LEFS score of 65/80 or greater.  Baseline: 49/80 Goal Status: INITIAL   2.  Patient will demonstrate ability to navigate uneven surfaces and obstacles with no more than minimal knee pain.  Baseline: unable to tolerate Goal status: INITIAL  3.  Patient will be able to walk for at least 20 minutes without knee locking  Goal status: INITIAL  4.  Patient will report confidence with returning to independent normal exercise routine and recreational activity to address weight management goals. Baseline: unable to tolerate these activities  Goal status: INITIAL     PLAN:  PT FREQUENCY: 1-2x/week  PT DURATION: 8 weeks  PLANNED INTERVENTIONS: 97164- PT Re-evaluation, 97750- Physical Performance Testing, 97110-Therapeutic exercises, 97530-  Therapeutic activity, [id number]- Neuromuscular re-education, [phone]- Self Care, 02859- Manual therapy, [id number]- Aquatic Therapy, [phone]- Electrical stimulation (unattended), [phone]- Ionotophoresis 4mg /ml Dexamethasone , Patient/Family education, Balance training, Taping, Joint mobilization, Cryotherapy, and Moist heat  PLAN FOR  NEXT SESSION: address knee AROM/AAROM, LQ strengthening program, weight bearing tolerance, TKE   Marko Molt, PT, DPT  10/02/2023 8:15 PM

## 2023-10-03 ENCOUNTER — Ambulatory Visit

## 2023-10-03 DIAGNOSIS — R600 Localized edema: Secondary | ICD-10-CM

## 2023-10-03 DIAGNOSIS — R262 Difficulty in walking, not elsewhere classified: Secondary | ICD-10-CM

## 2023-10-03 DIAGNOSIS — M25561 Pain in right knee: Secondary | ICD-10-CM | POA: Diagnosis not present

## 2023-10-03 DIAGNOSIS — G8929 Other chronic pain: Secondary | ICD-10-CM

## 2023-10-03 NOTE — Therapy (Signed)
 OUTPATIENT PHYSICAL THERAPY NOTE   Patient Name: Colleen Avila MRN: [id number] DOB:11/13/1980, 43 y.o., female Today's Date: 10/03/2023  END OF SESSION:    Past Medical History:  Diagnosis Date   Fibroid    History of recurrent UTIs    Past Surgical History:  Procedure Laterality Date   CESAREAN SECTION N/A 11/04/2020   Procedure: CESAREAN SECTION;  Surgeon: Alger Gong, MD;  Location: [facility] LD ORS;  Service: Obstetrics;  Laterality: N/A;   CHOLECYSTECTOMY     Patient Active Problem List   Diagnosis Date Noted   Delivery of pregnancy by cesarean section 11/04/2020   Indication for care in labor and delivery, antepartum 11/03/2020    PCP: Desiree Quale, NP   REFERRING PROVIDER:  Gretta Bertrum ORN, PA-C  Eval and Treat bilateral knees  Quad strengthening, modalities, HEP   REFERRING DIAG:  M25.561,M25.562,G89.29 (ICD-10-CM) - Chronic pain of both knees  M25.462 (ICD-10-CM) - Effusion of left knee  M25.461 (ICD-10-CM) - Effusion of right knee    THERAPY DIAG:  No diagnosis found.  Rationale for Evaluation and Treatment: Rehabilitation  ONSET DATE: 06/20/2023 date of referral   SUBJECTIVE:   SUBJECTIVE STATEMENT:  10/03/2023 Patient reporting no back pain and no LE pain today. She states that she was sick with the flu, and did not do her HEP; however, she also didn't notice any back or LE pain.   EVAL: Patient reports that she has been having more pain in her knees since December 2024. Her knee pain initially began when she fell on her R knee when she was pregnant with her 41 year old, then on both knees after she had the baby. She states that she feels its continuing because of carrying her 41 year old a lot, possibly with poor posture. She states that the other day, she was dancing while holding her daughter and she had an awful pain, and knee swelling. She states that she needed the diclofenac injections. She states that the doctor said that they could  take some fluid off of the knee, but I want to try Physical Therapy first. She also has knee stiffness and locking with walking. She would like to get back to normal activities, because now she is gaining weight.   PERTINENT HISTORY: Relevant PMHx includes Fibroids  PAIN:  Are you having pain?  Yes: NPRS scale: 3/10 current, 10/10 at worst Pain location: BIL knee pain  Pain description: cracking Aggravating factors: walking, standing, dancing  Relieving factors: rest   PRECAUTIONS: None  RED FLAGS: None   WEIGHT BEARING RESTRICTIONS: No  FALLS:  Has patient fallen in last 6 months? No  LIVING ENVIRONMENT: Lives with: lives with their family Lives in: House/apartment Stairs: Yes: Internal: 18-20 steps; can reach both and External: 4 steps; can reach both Has following equipment at home: None  OCCUPATION: [employer]   Some pain with pumping (foot pedal) some times   PLOF: Independent  PATIENT GOALS: to be able to walk normally and without pain; also return to exercising, running  NEXT MD VISIT: 12/27/2023 Neurology   OBJECTIVE:  Note: Objective measures were completed at Evaluation unless otherwise noted.  DIAGNOSTIC FINDINGS:   06/05/2023: BIL Knee MRI   IMPRESSION: Unremarkable MRI of the left knee.  IMPRESSION: No accelerated arthrosis or acute abnormality.   Trace edema deep to the iliotibial band. Correlation for mild iliotibial band syndrome.   Otherwise, unremarkable MRI of the right knee.  PATIENT SURVEYS:  LEFS   Extreme  difficulty/unable (0), Quite a bit of difficulty (1), Moderate difficulty (2), Little difficulty (3), No difficulty (4) Survey date:  08/29/2023  Any of your usual work, housework or school activities 3  2. Usual hobbies, recreational or sporting activities 2  3. Getting into/out of the bath 2  4. Walking between rooms 4  5. Putting on socks/shoes 4  6. Squatting  3  7. Lifting an object, like a bag of groceries  from the floor 3  8. Performing light activities around your home 3  9. Performing heavy activities around your home 2  10. Getting into/out of a car 2  11. Walking 2 blocks 3  12. Walking 1 mile 3  13. Going up/down 10 stairs (1 flight) 1  14. Standing for 1 hour 3  15.  sitting for 1 hour 3  16. Running on even ground 2  17. Running on uneven ground 1  18. Making sharp turns while running fast 2  19. Hopping  1  20. Rolling over in bed 2  Score total:  49/80     COGNITION: Overall cognitive status: Within functional limits for tasks assessed     LOWER EXTREMITY ROM:  Active ROM Right eval Left eval  Hip flexion    Hip extension    Hip abduction    Hip adduction    Hip internal rotation    Hip external rotation    Knee flexion 115 112  Knee extension 0 0  Ankle dorsiflexion    Ankle plantarflexion    Ankle inversion    Ankle eversion     (Blank rows = not tested)  LOWER EXTREMITY MMT:  MMT Right eval Left eval  Hip flexion 4+ 4+  Hip extension    Hip abduction 4+ 4+  Hip adduction    Hip internal rotation 4 4+  Hip external rotation 4+ 3+  Knee flexion 4+ 4+  Knee extension 4 4  Ankle dorsiflexion 4+ 4+  Ankle plantarflexion    Ankle inversion    Ankle eversion     (Blank rows = not tested)   FUNCTIONAL TESTS:  30 seconds chair stand test: 11 repetitions                                                                                                                                  TREATMENT DATE:    [facility] Adult PT Treatment:                                                DATE: 10/03/2023  Therapeutic Exercise: NuStep level 4, x 5 minutes  Clamshell 2 x 10 each  Seated heel slide 2 x 10 each, 3 sec hold  Updated and reviewed HEP   Neuromuscular re-ed: Supine Bridge, 2 x 10  LAQ  with 2lb x 15 each  Standing TKE with blue TB, 2 x 10 each        PATIENT EDUCATION:  Education details: reviewed initial home exercise program; discussion  of POC, prognosis and goals for skilled PT   Person educated: Patient Education method: Explanation, Demonstration, and Handouts Education comprehension: verbalized understanding, returned demonstration, and needs further education  HOME EXERCISE PROGRAM: Access Code: [email or web address] URL: [email or web address] Date: 09/26/2023 Prepared by: Marko Molt  Exercises - Seated Heel Slide  - 1 x daily - 7 x weekly - 2 sets - 10 reps - 3 sec hold - Seated Long Arc Quad  - 1 x daily - 7 x weekly - 2 sets - 10 reps - 3 sec hold - Clamshell  - 1 x daily - 7 x weekly - 2 sets - 10 reps - Supine Bridge  - 1 x daily - 7 x weekly - 2 sets - 10 reps - 3 sec hold  ASSESSMENT:  CLINICAL IMPRESSION:  10/03/2023: Charyl had good tolerance of today's treatment session, which focused on LE strengthening program and NuStep for AAROM and pain modulation. Patient had less pain with strengthening activities. We will continue to progress per POC as tolerated, in order to reach established rehab goals.      EVAL: Dyann is a 43 y.o. female who was seen today for physical therapy evaluation and treatment for persistent BIL knee pain with mobility deficits. She is demonstrating decreased knee flexion AROM BIL, and decreased BIL LE MMT scores. She has related pain and difficulty with prolonged standing, prolonged walking, dancing with her family and performing moderate-to-heavy household chores. She requires skilled PT services at this time to address relevant deficits and improve overall function.     OBJECTIVE IMPAIRMENTS: decreased activity tolerance, decreased ROM, decreased strength, improper body mechanics, and pain.   ACTIVITY LIMITATIONS: carrying, lifting, standing, squatting, sleeping, stairs, locomotion level, and caring for others  PARTICIPATION LIMITATIONS: meal prep, cleaning, laundry, interpersonal relationship, driving, shopping, community activity, and occupation  PERSONAL FACTORS: Time  since onset of injury/illness/exacerbation and 1 comorbidity: Fibroids are also affecting patient's functional outcome.   REHAB POTENTIAL: Fair    CLINICAL DECISION MAKING: Stable/uncomplicated  EVALUATION COMPLEXITY: Low   GOALS: Goals reviewed with patient? YES  SHORT TERM GOALS: Target date: 09/26/2023   Patient will be independent with initial home program at least 3 days/week.  Baseline: provided at eval Goal Status: INITIAL   2.  Patient will demonstrate improved BIL LE MMT scores to at least 4+/5 in all motions measured Baseline: see objective measures Goal Status: INITIAL   3.  Patient will be able to perform at least 13 STS in 30 seconds Baseline: 11 Goal status: INITIAL   LONG TERM GOALS: Target date: 10/24/2023   Patient will report improved overall functional ability with LEFS score of 65/80 or greater.  Baseline: 49/80 Goal Status: INITIAL   2.  Patient will demonstrate ability to navigate uneven surfaces and obstacles with no more than minimal knee pain.  Baseline: unable to tolerate Goal status: INITIAL  3.  Patient will be able to walk for at least 20 minutes without knee locking  Goal status: INITIAL  4.  Patient will report confidence with returning to independent normal exercise routine and recreational activity to address weight management goals. Baseline: unable to tolerate these activities  Goal status: INITIAL     PLAN:  PT FREQUENCY: 1-2x/week  PT DURATION: 8 weeks  PLANNED INTERVENTIONS: 02835- PT Re-evaluation, 97750-  Physical Performance Testing, 97110-Therapeutic exercises, 97530- Therapeutic activity, [id number]- Neuromuscular re-education, [phone]- Self Care, 02859- Manual therapy, [phone]- Aquatic Therapy, [phone]- Electrical stimulation (unattended), [phone]- Ionotophoresis 4mg /ml Dexamethasone , Patient/Family education, Balance training, Taping, Joint mobilization, Cryotherapy, and Moist heat  PLAN FOR NEXT SESSION: address knee AROM/AAROM,  LQ strengthening program, weight bearing tolerance, TKE   Marko Molt, PT, DPT  10/03/2023 5:06 PM

## 2023-10-05 ENCOUNTER — Ambulatory Visit

## 2023-10-11 ENCOUNTER — Ambulatory Visit: Payer: Self-pay

## 2023-10-18 ENCOUNTER — Ambulatory Visit: Payer: Self-pay

## 2023-10-18 NOTE — Therapy (Incomplete)
 OUTPATIENT PHYSICAL THERAPY NOTE   Patient Name: Colleen Avila MRN: [id number] DOB:1980-10-03, 43 y.o., female Today's Date: 10/18/2023  END OF SESSION:      Past Medical History:  Diagnosis Date   Fibroid    History of recurrent UTIs    Past Surgical History:  Procedure Laterality Date   CESAREAN SECTION N/A 11/04/2020   Procedure: CESAREAN SECTION;  Surgeon: Alger Gong, MD;  Location: [facility] LD ORS;  Service: Obstetrics;  Laterality: N/A;   CHOLECYSTECTOMY     Patient Active Problem List   Diagnosis Date Noted   Delivery of pregnancy by cesarean section 11/04/2020   Indication for care in labor and delivery, antepartum 11/03/2020    PCP: Desiree Quale, NP   REFERRING PROVIDER:  Gretta Bertrum ORN, PA-C  Eval and Treat bilateral knees  Quad strengthening, modalities, HEP   REFERRING DIAG:  M25.561,M25.562,G89.29 (ICD-10-CM) - Chronic pain of both knees  M25.462 (ICD-10-CM) - Effusion of left knee  M25.461 (ICD-10-CM) - Effusion of right knee    THERAPY DIAG:  No diagnosis found.  Rationale for Evaluation and Treatment: Rehabilitation  ONSET DATE: 06/20/2023 date of referral   SUBJECTIVE:   SUBJECTIVE STATEMENT:  *** 10/18/2023 Patient reporting no back pain and no LE pain today. She states that she was sick with the flu, and did not do her HEP; however, she also didn't notice any back or LE pain.   EVAL: Patient reports that she has been having more pain in her knees since December 2024. Her knee pain initially began when she fell on her R knee when she was pregnant with her 41 year old, then on both knees after she had the baby. She states that she feels its continuing because of carrying her 42 year old a lot, possibly with poor posture. She states that the other day, she was dancing while holding her daughter and she had an awful pain, and knee swelling. She states that she needed the diclofenac injections. She states that the doctor said that they  could take some fluid off of the knee, but I want to try Physical Therapy first. She also has knee stiffness and locking with walking. She would like to get back to normal activities, because now she is gaining weight.   PERTINENT HISTORY: Relevant PMHx includes Fibroids  PAIN:  Are you having pain?  Yes: NPRS scale: 3/10 current, 10/10 at worst Pain location: BIL knee pain  Pain description: cracking Aggravating factors: walking, standing, dancing  Relieving factors: rest   PRECAUTIONS: None  RED FLAGS: None   WEIGHT BEARING RESTRICTIONS: No  FALLS:  Has patient fallen in last 6 months? No  LIVING ENVIRONMENT: Lives with: lives with their family Lives in: House/apartment Stairs: Yes: Internal: 18-20 steps; can reach both and External: 4 steps; can reach both Has following equipment at home: None  OCCUPATION: [employer]   Some pain with pumping (foot pedal) some times   PLOF: Independent  PATIENT GOALS: to be able to walk normally and without pain; also return to exercising, running  NEXT MD VISIT: 12/27/2023 Neurology   OBJECTIVE:  Note: Objective measures were completed at Evaluation unless otherwise noted.  DIAGNOSTIC FINDINGS:   06/05/2023: BIL Knee MRI   IMPRESSION: Unremarkable MRI of the left knee.  IMPRESSION: No accelerated arthrosis or acute abnormality.   Trace edema deep to the iliotibial band. Correlation for mild iliotibial band syndrome.   Otherwise, unremarkable MRI of the right knee.  PATIENT SURVEYS:  LEFS  Extreme difficulty/unable (0), Quite a bit of difficulty (1), Moderate difficulty (2), Little difficulty (3), No difficulty (4) Survey date:  08/29/2023  Any of your usual work, housework or school activities 3  2. Usual hobbies, recreational or sporting activities 2  3. Getting into/out of the bath 2  4. Walking between rooms 4  5. Putting on socks/shoes 4  6. Squatting  3  7. Lifting an object, like a bag of  groceries from the floor 3  8. Performing light activities around your home 3  9. Performing heavy activities around your home 2  10. Getting into/out of a car 2  11. Walking 2 blocks 3  12. Walking 1 mile 3  13. Going up/down 10 stairs (1 flight) 1  14. Standing for 1 hour 3  15.  sitting for 1 hour 3  16. Running on even ground 2  17. Running on uneven ground 1  18. Making sharp turns while running fast 2  19. Hopping  1  20. Rolling over in bed 2  Score total:  49/80     COGNITION: Overall cognitive status: Within functional limits for tasks assessed     LOWER EXTREMITY ROM:  Active ROM Right eval Left eval  Hip flexion    Hip extension    Hip abduction    Hip adduction    Hip internal rotation    Hip external rotation    Knee flexion 115 112  Knee extension 0 0  Ankle dorsiflexion    Ankle plantarflexion    Ankle inversion    Ankle eversion     (Blank rows = not tested)  LOWER EXTREMITY MMT:  MMT Right eval Left eval  Hip flexion 4+ 4+  Hip extension    Hip abduction 4+ 4+  Hip adduction    Hip internal rotation 4 4+  Hip external rotation 4+ 3+  Knee flexion 4+ 4+  Knee extension 4 4  Ankle dorsiflexion 4+ 4+  Ankle plantarflexion    Ankle inversion    Ankle eversion     (Blank rows = not tested)   FUNCTIONAL TESTS:  30 seconds chair stand test: 11 repetitions                                                                                                                                  TREATMENT DATE:  [facility] Adult PT Treatment:                                                DATE: 10/18/2023 Therapeutic Exercise: NuStep level 4, x 5 minutes  Clamshell 2 x 10 each  Seated heel slide 2 x 10 each, 3 sec hold  Neuromuscular re-ed: Supine Bridge, 2 x 10  LAQ with 2lb x 15 each  Standing TKE  with blue TB, 2 x 10 each   [facility] Adult PT Treatment:                                                DATE: 10/03/2023  Therapeutic Exercise: NuStep level  4, x 5 minutes  Clamshell 2 x 10 each  Seated heel slide 2 x 10 each, 3 sec hold  Updated and reviewed HEP   Neuromuscular re-ed: Supine Bridge, 2 x 10  LAQ with 2lb x 15 each  Standing TKE with blue TB, 2 x 10 each        PATIENT EDUCATION:  Education details: reviewed initial home exercise program; discussion of POC, prognosis and goals for skilled PT   Person educated: Patient Education method: Explanation, Demonstration, and Handouts Education comprehension: verbalized understanding, returned demonstration, and needs further education  HOME EXERCISE PROGRAM: Access Code: [email or web address] URL: [email or web address] Date: 09/26/2023 Prepared by: Marko Molt  Exercises - Seated Heel Slide  - 1 x daily - 7 x weekly - 2 sets - 10 reps - 3 sec hold - Seated Long Arc Quad  - 1 x daily - 7 x weekly - 2 sets - 10 reps - 3 sec hold - Clamshell  - 1 x daily - 7 x weekly - 2 sets - 10 reps - Supine Bridge  - 1 x daily - 7 x weekly - 2 sets - 10 reps - 3 sec hold  ASSESSMENT:  CLINICAL IMPRESSION: ***  10/18/2023: Colleen Avila had good tolerance of today's treatment session, which focused on LE strengthening program and NuStep for AAROM and pain modulation. Patient had less pain with strengthening activities. We will continue to progress per POC as tolerated, in order to reach established rehab goals.      EVAL: Colleen Avila is a 43 y.o. female who was seen today for physical therapy evaluation and treatment for persistent BIL knee pain with mobility deficits. She is demonstrating decreased knee flexion AROM BIL, and decreased BIL LE MMT scores. She has related pain and difficulty with prolonged standing, prolonged walking, dancing with her family and performing moderate-to-heavy household chores. She requires skilled PT services at this time to address relevant deficits and improve overall function.     OBJECTIVE IMPAIRMENTS: decreased activity tolerance, decreased ROM, decreased  strength, improper body mechanics, and pain.   ACTIVITY LIMITATIONS: carrying, lifting, standing, squatting, sleeping, stairs, locomotion level, and caring for others  PARTICIPATION LIMITATIONS: meal prep, cleaning, laundry, interpersonal relationship, driving, shopping, community activity, and occupation  PERSONAL FACTORS: Time since onset of injury/illness/exacerbation and 1 comorbidity: Fibroids are also affecting patient's functional outcome.   REHAB POTENTIAL: Fair    CLINICAL DECISION MAKING: Stable/uncomplicated  EVALUATION COMPLEXITY: Low   GOALS: Goals reviewed with patient? YES  SHORT TERM GOALS: Target date: 09/26/2023   Patient will be independent with initial home program at least 3 days/week.  Baseline: provided at eval Goal Status: INITIAL   2.  Patient will demonstrate improved BIL LE MMT scores to at least 4+/5 in all motions measured Baseline: see objective measures Goal Status: INITIAL   3.  Patient will be able to perform at least 13 STS in 30 seconds Baseline: 11 Goal status: INITIAL   LONG TERM GOALS: Target date: 10/24/2023   Patient will report improved overall functional ability with LEFS score of 65/80 or greater.  Baseline: 49/80 Goal  Status: INITIAL   2.  Patient will demonstrate ability to navigate uneven surfaces and obstacles with no more than minimal knee pain.  Baseline: unable to tolerate Goal status: INITIAL  3.  Patient will be able to walk for at least 20 minutes without knee locking  Goal status: INITIAL  4.  Patient will report confidence with returning to independent normal exercise routine and recreational activity to address weight management goals. Baseline: unable to tolerate these activities  Goal status: INITIAL     PLAN:  PT FREQUENCY: 1-2x/week  PT DURATION: 8 weeks  PLANNED INTERVENTIONS: 97164- PT Re-evaluation, 97750- Physical Performance Testing, 97110-Therapeutic exercises, 97530- Therapeutic activity,  97112- Neuromuscular re-education, 97535- Self Care, 02859- Manual therapy, [phone]- Aquatic Therapy, [id number]- Electrical stimulation (unattended), [phone]- Ionotophoresis 4mg /ml Dexamethasone , Patient/Family education, Balance training, Taping, Joint mobilization, Cryotherapy, and Moist heat  PLAN FOR NEXT SESSION: address knee AROM/AAROM, LQ strengthening program, weight bearing tolerance, TKE   Corean Pouch PTA  10/18/2023 8:53 AM

## 2023-11-02 ENCOUNTER — Ambulatory Visit: Payer: Self-pay

## 2023-11-23 ENCOUNTER — Ambulatory Visit

## 2023-11-30 ENCOUNTER — Ambulatory Visit

## 2023-12-25 ENCOUNTER — Ambulatory Visit: Attending: Student

## 2023-12-25 NOTE — Therapy (Incomplete)
 OUTPATIENT PHYSICAL THERAPY NOTE   Patient Name: Colleen Avila MRN: [id number] DOB:Mar 25, 1980, 43 y.o., female Today's Date: 12/25/2023  END OF SESSION:      Past Medical History:  Diagnosis Date   Fibroid    History of recurrent UTIs    Past Surgical History:  Procedure Laterality Date   CESAREAN SECTION N/A 11/04/2020   Procedure: CESAREAN SECTION;  Surgeon: Alger Gong, MD;  Location: [facility] LD [facility]S;  Service: Obstetrics;  Laterality: N/A;   CHOLECYSTECTOMY     Patient Active Problem List   Diagnosis Date Noted   Delivery of pregnancy by cesarean section 11/04/2020   Indication for care in labor and delivery, antepartum 11/03/2020    PCP: Desiree Quale, NP   REFERRING PROVIDER:  Gretta Bertrum ORN, PA-C  Eval and Treat bilateral knees  Quad strengthening, modalities, HEP   REFERRING DIAG:  M25.561,M25.562,G89.29 (ICD-10-CM) - Chronic pain of both knees  M25.462 (ICD-10-CM) - Effusion of left knee  M25.461 (ICD-10-CM) - Effusion of right knee    THERAPY DIAG:  No diagnosis found.  Rationale for Evaluation and Treatment: Rehabilitation  ONSET DATE: 06/20/2023 date of referral   SUBJECTIVE:   SUBJECTIVE STATEMENT:  12/25/2023 Patient reporting no back pain and no LE pain today. She states that she was sick with the flu, and did not do her HEP; however, she also didn't notice any back or LE pain.   EVAL: Patient reports that she has been having more pain in her knees since December 2024. Her knee pain initially began when she fell on her R knee when she was pregnant with her 41 year old, then on both knees after she had the baby. She states that she feels its continuing because of carrying her 41 year old a lot, possibly with poor posture. She states that the other day, she was dancing while holding her daughter and she had an awful pain, and knee swelling. She states that she needed the diclofenac injections. She states that the doctor said that they  could take some fluid off of the knee, but I want to try Physical Therapy first. She also has knee stiffness and locking with walking. She would like to get back to normal activities, because now she is gaining weight.   PERTINENT HISTORY: Relevant PMHx includes Fibroids  PAIN:  Are you having pain?  Yes: NPRS scale: 3/10 current, 10/10 at worst Pain location: BIL knee pain  Pain description: cracking Aggravating factors: walking, standing, dancing  Relieving factors: rest   PRECAUTIONS: None  RED FLAGS: None   WEIGHT BEARING RESTRICTIONS: No  FALLS:  Has patient fallen in last 6 months? No  LIVING ENVIRONMENT: Lives with: lives with their family Lives in: House/apartment Stairs: Yes: Internal: 18-20 steps; can reach both and External: 4 steps; can reach both Has following equipment at home: None  OCCUPATION: [employer]   Some pain with pumping (foot pedal) some times   PLOF: Independent  PATIENT GOALS: to be able to walk normally and without pain; also return to exercising, running  NEXT MD VISIT: 12/27/2023 Neurology   OBJECTIVE:  Note: Objective measures were completed at Evaluation unless otherwise noted.  DIAGNOSTIC FINDINGS:   06/05/2023: BIL Knee MRI   IMPRESSION: Unremarkable MRI of the left knee.  IMPRESSION: No accelerated arthrosis or acute abnormality.   Trace edema deep to the iliotibial band. Correlation for mild iliotibial band syndrome.   Otherwise, unremarkable MRI of the right knee.  PATIENT SURVEYS:  LEFS  Extreme difficulty/unable (0), Quite a bit of difficulty (1), Moderate difficulty (2), Little difficulty (3), No difficulty (4) Survey date:  08/29/2023  Any of your usual work, housework or school activities 3  2. Usual hobbies, recreational or sporting activities 2  3. Getting into/out of the bath 2  4. Walking between rooms 4  5. Putting on socks/shoes 4  6. Squatting  3  7. Lifting an object, like a bag of  groceries from the floor 3  8. Performing light activities around your home 3  9. Performing heavy activities around your home 2  10. Getting into/out of a car 2  11. Walking 2 blocks 3  12. Walking 1 mile 3  13. Going up/down 10 stairs (1 flight) 1  14. Standing for 1 hour 3  15.  sitting for 1 hour 3  16. Running on even ground 2  17. Running on uneven ground 1  18. Making sharp turns while running fast 2  19. Hopping  1  20. Rolling over in bed 2  Score total:  49/80     COGNITION: Overall cognitive status: Within functional limits for tasks assessed     LOWER EXTREMITY ROM:  Active ROM Right eval Left eval  Hip flexion    Hip extension    Hip abduction    Hip adduction    Hip internal rotation    Hip external rotation    Knee flexion 115 112  Knee extension 0 0  Ankle dorsiflexion    Ankle plantarflexion    Ankle inversion    Ankle eversion     (Blank rows = not tested)  LOWER EXTREMITY MMT:  MMT Right eval Left eval  Hip flexion 4+ 4+  Hip extension    Hip abduction 4+ 4+  Hip adduction    Hip internal rotation 4 4+  Hip external rotation 4+ 3+  Knee flexion 4+ 4+  Knee extension 4 4  Ankle dorsiflexion 4+ 4+  Ankle plantarflexion    Ankle inversion    Ankle eversion     (Blank rows = not tested)   FUNCTIONAL TESTS:  30 seconds chair stand test: 11 repetitions                                                                                                                                  TREATMENT DATE:    [facility] Adult PT Treatment:                                                DATE: 10/03/2023  Therapeutic Exercise: NuStep level 4, x 5 minutes  Clamshell 2 x 10 each  Seated heel slide 2 x 10 each, 3 sec hold  Updated and reviewed HEP   Neuromuscular re-ed: Supine Bridge, 2 x 10  LAQ with 2lb x 15 each  Standing TKE with blue TB, 2 x 10 each        PATIENT EDUCATION:  Education details: reviewed initial home exercise program;  discussion of POC, prognosis and goals for skilled PT   Person educated: Patient Education method: Explanation, Demonstration, and Handouts Education comprehension: verbalized understanding, returned demonstration, and needs further education  HOME EXERCISE PROGRAM: Access Code: [email or web address] URL: [email or web address] Date: 09/26/2023 Prepared by: Marko Molt  Exercises - Seated Heel Slide  - 1 x daily - 7 x weekly - 2 sets - 10 reps - 3 sec hold - Seated Long Arc Quad  - 1 x daily - 7 x weekly - 2 sets - 10 reps - 3 sec hold - Clamshell  - 1 x daily - 7 x weekly - 2 sets - 10 reps - Supine Bridge  - 1 x daily - 7 x weekly - 2 sets - 10 reps - 3 sec hold  ASSESSMENT:  CLINICAL IMPRESSION:  12/25/2023: Colleen Avila had good tolerance of today's treatment session, which focused on LE strengthening program and NuStep for AAROM and pain modulation. Patient had less pain with strengthening activities. We will continue to progress per POC as tolerated, in order to reach established rehab goals.      EVAL: Colleen Avila is a 43 y.o. female who was seen today for physical therapy evaluation and treatment for persistent BIL knee pain with mobility deficits. She is demonstrating decreased knee flexion AROM BIL, and decreased BIL LE MMT scores. She has related pain and difficulty with prolonged standing, prolonged walking, dancing with her family and performing moderate-to-heavy household chores. She requires skilled PT services at this time to address relevant deficits and improve overall function.     OBJECTIVE IMPAIRMENTS: decreased activity tolerance, decreased ROM, decreased strength, improper body mechanics, and pain.   ACTIVITY LIMITATIONS: carrying, lifting, standing, squatting, sleeping, stairs, locomotion level, and caring for others  PARTICIPATION LIMITATIONS: meal prep, cleaning, laundry, interpersonal relationship, driving, shopping, community activity, and occupation  PERSONAL  FACTORS: Time since onset of injury/illness/exacerbation and 1 comorbidity: Fibroids are also affecting patient's functional outcome.   REHAB POTENTIAL: Fair    CLINICAL DECISION MAKING: Stable/uncomplicated  EVALUATION COMPLEXITY: Low   GOALS: Goals reviewed with patient? YES  SHORT TERM GOALS: Target date: 09/26/2023   Patient will be independent with initial home program at least 3 days/week.  Baseline: provided at eval Goal Status: INITIAL   2.  Patient will demonstrate improved BIL LE MMT scores to at least 4+/5 in all motions measured Baseline: see objective measures Goal Status: INITIAL   3.  Patient will be able to perform at least 13 STS in 30 seconds Baseline: 11 Goal status: INITIAL   LONG TERM GOALS: Target date: 10/24/2023   Patient will report improved overall functional ability with LEFS score of 65/80 or greater.  Baseline: 49/80 Goal Status: INITIAL   2.  Patient will demonstrate ability to navigate uneven surfaces and obstacles with no more than minimal knee pain.  Baseline: unable to tolerate Goal status: INITIAL  3.  Patient will be able to walk for at least 20 minutes without knee locking  Goal status: INITIAL  4.  Patient will report confidence with returning to independent normal exercise routine and recreational activity to address weight management goals. Baseline: unable to tolerate these activities  Goal status: INITIAL     PLAN:  PT FREQUENCY: 1-2x/week  PT DURATION: 8 weeks  PLANNED INTERVENTIONS: 02835- PT Re-evaluation,  02249- Physical Performance Testing, 97110-Therapeutic exercises, 97530- Therapeutic activity, [id number]- Neuromuscular re-education, [phone]- Self Care, 02859- Manual therapy, [phone]- Aquatic Therapy, [phone]- Electrical stimulation (unattended), [phone]- Ionotophoresis 4mg /ml Dexamethasone , Patient/Family education, Balance training, Taping, Joint mobilization, Cryotherapy, and Moist heat  PLAN FOR NEXT SESSION: address  knee AROM/AAROM, LQ strengthening program, weight bearing tolerance, TKE   Marko Molt, PT, DPT  12/25/2023 5:04 PM

## 2023-12-27 ENCOUNTER — Encounter: Payer: Self-pay | Admitting: Diagnostic Neuroimaging

## 2023-12-27 ENCOUNTER — Ambulatory Visit: Admitting: Diagnostic Neuroimaging

## 2023-12-27 VITALS — BP 119/78 | HR 74 | Ht 61.81 in | Wt 161.4 lb

## 2023-12-27 DIAGNOSIS — R200 Anesthesia of skin: Secondary | ICD-10-CM

## 2023-12-27 NOTE — Patient Instructions (Signed)
  INTERMITTENT RIGHT ARM NUMBNESS (positional)  - try to avoid bent arm position for long time; use arm in pillowcase to keep arm straight during sleep - refer to OT evaluation - if not improving in 4-6 weeks, then EMG  Intermittent right eye twitching (likely benign fasciculations) - could be related to stress, sleep; mild symptoms; monitor for now  Intermittent right leg / feet numbness - continue right knee PT

## 2023-12-27 NOTE — Progress Notes (Signed)
 [facility]  PATIENT: Colleen Avila DOB: 04/09/1980  REFERRING CLINICIAN: Desiree Quale, NP HISTORY FROM: patient  REASON FOR VISIT: new consult   HISTORICAL  CHIEF COMPLAINT:  Chief Complaint  Patient presents with   RM 7     Patient is here with interpreter for pain in right shoulder  and arm with numbness, and weakness    HISTORY OF PRESENT ILLNESS:   43 year old female here for evaluation of numbness and tingling.  1 year ago patient had onset of right arm numbness rating down to the right hand.  This typically occurred when she was carrying her baby or bending her arm with her phone.  Symptoms are gradually improved over time.  Also having some intermittent right leg numbness issues.  Also has had some intermittent twitching of the right eye.     REVIEW OF SYSTEMS: Full 14 system review of systems performed and negative with exception of: as per HPI.  ALLERGIES: No Known Allergies  HOME MEDICATIONS: Outpatient Medications Prior to Visit  Medication Sig Dispense Refill   cetirizine (ZYRTEC) 10 MG tablet Take 10 mg by mouth daily.     metFORMIN (GLUCOPHAGE) 500 MG tablet Take 500 mg by mouth 2 (two) times daily.     montelukast (SINGULAIR) 10 MG tablet Take 10 mg by mouth daily.     VITAMIN D PO Take by mouth.     acetaminophen  (TYLENOL ) 325 MG tablet Take 650 mg by mouth every 6 (six) hours as needed. (Patient not taking: Reported on 12/27/2023)     ibuprofen  (ADVIL ) 600 MG tablet Take 1 tablet (600 mg total) by mouth every 6 (six) hours as needed. (Patient not taking: Reported on 12/27/2023) 30 tablet 0   meclizine  (ANTIVERT ) 25 MG tablet Take 1 tablet (25 mg total) by mouth 3 (three) times daily as needed for dizziness. (Patient not taking: Reported on 12/27/2023) 30 tablet 0   ondansetron  (ZOFRAN -ODT) 4 MG disintegrating tablet Take 1 tablet (4 mg total) by mouth every 8 (eight) hours as needed for nausea or vomiting. (Patient not  taking: Reported on 12/27/2023) 20 tablet 0   senna-docusate (SENOKOT-S) 8.6-50 MG tablet Take 2 tablets by mouth daily. (Patient not taking: Reported on 12/27/2023) 15 tablet 0   triamcinolone cream (KENALOG) 0.1 % Apply topically. (Patient not taking: Reported on 12/27/2023)     No facility-administered medications prior to visit.    PAST MEDICAL HISTORY: Past Medical History:  Diagnosis Date   Fibroid    History of recurrent UTIs     PAST SURGICAL HISTORY: Past Surgical History:  Procedure Laterality Date   CESAREAN SECTION N/A 11/04/2020   Procedure: CESAREAN SECTION;  Surgeon: Alger Gong, MD;  Location: [facility] LD ORS;  Service: Obstetrics;  Laterality: N/A;   CHOLECYSTECTOMY      FAMILY HISTORY: Family History  Problem Relation Age of Onset   Diabetes Maternal Grandmother    Hypertension Maternal Grandmother    Diabetes Maternal Grandfather    Hypertension Maternal Grandfather    Diabetes Paternal Grandmother    Hypertension Paternal Grandmother    Diabetes Paternal Grandfather    Hypertension Paternal Grandfather    Migraines Other    Seizures Neg Hx    Stroke Neg Hx     SOCIAL HISTORY: Social History   Socioeconomic History   Marital status: Single    Spouse name: Not on file   Number of children: Not on file   Years of education: Not on file   Highest  education level: Not on file  Occupational History   Not on file  Tobacco Use   Smoking status: Never   Smokeless tobacco: Never  Vaping Use   Vaping status: Never Used  Substance and Sexual Activity   Alcohol use: Never   Drug use: Never   Sexual activity: Not Currently    Birth control/protection: None  Other Topics Concern   Not on file  Social History Narrative   1 cup of caffeine daily    Social Drivers of Health   Financial Resource Strain: Not on file  Food Insecurity: Not on file  Transportation Needs: Not on file  Physical Activity: Not on file  Stress: Not on file  Social  Connections: Not on file  Intimate Partner Violence: Not on file     PHYSICAL EXAM  GENERAL EXAM/CONSTITUTIONAL: Vitals:  Vitals:   12/27/23 0900  BP: 119/78  Pulse: 74  Weight: 161 lb 6.4 oz (73.2 kg)  Height: 5' 1.81 (1.57 m)   Body mass index is 29.7 kg/m. Wt Readings from Last 3 Encounters:  12/27/23 161 lb 6.4 oz (73.2 kg)  11/11/20 157 lb 1.6 oz (71.3 kg)  11/03/20 172 lb 12.8 oz (78.4 kg)   Patient is in no distress; well developed, nourished and groomed; neck is supple  CARDIOVASCULAR: Examination of carotid arteries is normal; no carotid bruits Regular rate and rhythm, no murmurs Examination of peripheral vascular system by observation and palpation is normal  EYES: Ophthalmoscopic exam of optic discs and posterior segments is normal; no papilledema or hemorrhages No results found.  MUSCULOSKELETAL: Gait, strength, tone, movements noted in Neurologic exam below  NEUROLOGIC: MENTAL STATUS:      No data to display         awake, alert, oriented to person, place and time recent and remote memory intact normal attention and concentration language fluent, comprehension intact, naming intact fund of knowledge appropriate  CRANIAL NERVE:  2nd - no papilledema on fundoscopic exam 2nd, 3rd, 4th, 6th - pupils equal and reactive to light, visual fields full to confrontation, extraocular muscles intact, no nystagmus 5th - facial sensation symmetric 7th - facial strength symmetric 8th - hearing intact 9th - palate elevates symmetrically, uvula midline 11th - shoulder shrug symmetric 12th - tongue protrusion midline  MOTOR:  normal bulk and tone, full strength in the BUE, BLE  SENSORY:  normal and symmetric to light touch, temperature, vibration  COORDINATION:  finger-nose-finger, fine finger movements normal  REFLEXES:  deep tendon reflexes 1+ and symmetric  GAIT/STATION:  narrow based gait     DIAGNOSTIC DATA (LABS, IMAGING, TESTING) - I  reviewed patient records, labs, notes, testing and imaging myself where available.  Lab Results  Component Value Date   WBC 8.7 11/05/2020   HGB 8.1 (L) 11/05/2020   HCT 24.3 (L) 11/05/2020   MCV 90.3 11/05/2020   PLT 194 11/05/2020      Component Value Date/Time   NA 136 10/03/2008 2235   K 3.2 (L) 10/03/2008 2235   CL 104 10/03/2008 2235   CO2 25 10/03/2008 2235   GLUCOSE 105 (H) 10/03/2008 2235   BUN 14 10/03/2008 2235   CREATININE 0.52 11/03/2020 1209   CALCIUM 9.2 10/03/2008 2235   PROT 6.9 10/03/2008 2235   ALBUMIN  3.8 10/03/2008 2235   AST 32 10/03/2008 2235   ALT 27 10/03/2008 2235   ALKPHOS 92 10/03/2008 2235   BILITOT 0.3 10/03/2008 2235   GFRNONAA >60 11/03/2020 1209   GFRAA  10/03/2008 2235    >60        The eGFR has been calculated using the MDRD equation. This calculation has not been validated in all clinical situations. eGFR's persistently <60 mL/min signify possible Chronic Kidney Disease.   No results found for: CHOL, HDL, LDLCALC, LDLDIRECT, TRIG, CHOLHDL No results found for: YHAJ8R No results found for: VITAMINB12 No results found for: TSH    ASSESSMENT AND PLAN  43 y.o. year old female here with:   Dx:  1. Right arm numbness     PLAN:  INTERMITTENT RIGHT ARM NUMBNESS (positional)  - try to avoid bent arm position for long time; use arm in pillowcase to keep arm straight during sleep - refer to OT evaluation - if not improving in 4-6 weeks, then EMG  Intermittent right eye twitching (likely benign fasciculations) - could be related to stress, sleep; mild symptoms; monitor for now  Intermittent right leg / feet numbness - continue right knee PT  Orders Placed This Encounter  Procedures   Ambulatory referral to Occupational Therapy   Return for pending if symptoms worsen or fail to improve.    EDUARD FABIENE HANLON, MD 12/27/2023, 9:52 AM Certified in Neurology, Neurophysiology and Neuroimaging  [facility] [address] [place], [place] [address] [phone]

## 2023-12-29 ENCOUNTER — Encounter: Payer: Self-pay | Admitting: Diagnostic Neuroimaging

## 2024-01-20 ENCOUNTER — Encounter (HOSPITAL_COMMUNITY): Payer: Self-pay

## 2024-01-20 ENCOUNTER — Inpatient Hospital Stay (HOSPITAL_COMMUNITY)
Admission: AD | Admit: 2024-01-20 | Discharge: 2024-01-20 | Disposition: A | Discharge status: Home / Self Care | Attending: Obstetrics & Gynecology | Admitting: Obstetrics & Gynecology

## 2024-01-20 ENCOUNTER — Inpatient Hospital Stay (HOSPITAL_COMMUNITY)

## 2024-01-20 DIAGNOSIS — R7989 Other specified abnormal findings of blood chemistry: Secondary | ICD-10-CM

## 2024-01-20 DIAGNOSIS — Z3A00 Weeks of gestation of pregnancy not specified: Secondary | ICD-10-CM | POA: Diagnosis not present

## 2024-01-20 DIAGNOSIS — Z758 Other problems related to medical facilities and other health care: Secondary | ICD-10-CM

## 2024-01-20 DIAGNOSIS — O020 Blighted ovum and nonhydatidiform mole: Secondary | ICD-10-CM | POA: Diagnosis not present

## 2024-01-20 LAB — URINALYSIS, ROUTINE W REFLEX MICROSCOPIC
Bilirubin Urine: NEGATIVE
Glucose, UA: NEGATIVE mg/dL
Hgb urine dipstick: NEGATIVE
Ketones, ur: NEGATIVE mg/dL
Leukocytes,Ua: NEGATIVE
Nitrite: NEGATIVE
Protein, ur: NEGATIVE mg/dL
Specific Gravity, Urine: 1.019 (ref 1.005–1.030)
pH: 6 (ref 5.0–8.0)

## 2024-01-20 LAB — CBC
HCT: 42.3 % (ref 36.0–46.0)
Hemoglobin: 14.3 g/dL (ref 12.0–15.0)
MCH: 28.6 pg (ref 26.0–34.0)
MCHC: 33.8 g/dL (ref 30.0–36.0)
MCV: 84.6 fL (ref 80.0–100.0)
Platelets: 353 K/uL (ref 150–400)
RBC: 5 MIL/uL (ref 3.87–5.11)
RDW: 12.9 % (ref 11.5–15.5)
WBC: 7.2 K/uL (ref 4.0–10.5)
nRBC: 0 % (ref 0.0–0.2)

## 2024-01-20 LAB — WET PREP, GENITAL
Clue Cells Wet Prep HPF POC: NONE SEEN
Sperm: NONE SEEN
Trich, Wet Prep: NONE SEEN
WBC, Wet Prep HPF POC: <10 (ref <10)
Yeast Wet Prep HPF POC: NONE SEEN

## 2024-01-20 LAB — ABO/RH: ABO/RH(D): O POS

## 2024-01-20 LAB — HCG, QUANTITATIVE, PREGNANCY: hCG, Beta Chain, Quant, S: 2591 m[IU]/mL — ABNORMAL HIGH (ref <5)

## 2024-01-20 NOTE — MAU Note (Signed)
..  Colleen Avila is a 43 y.o. at [redacted]w[redacted]d here in MAU reporting: [facility] She had serial quants and they did not rise appropriately. She was told yesterday she had an ectopic pregnancy and needed to come to the emergency department, but was unable to get off of work. Patient had an ultrasound and they were unable to see anything yet. Denies VB or abnormal discharge. She reports yesterday she started having significant lower abdominal pressure and continues to have sharp pain in her lower abdomen.   Pain score: 5 Vitals:   01/20/24 1029 01/20/24 1031  BP:  122/74  Pulse: 64   Resp: 16   Temp:  98.4 F (36.9 C)  SpO2: 100%      Lab orders placed from triage:   UA

## 2024-01-20 NOTE — MAU Provider Note (Signed)
 In house [place] SPANISH SPEAKING INT Present during the entire encounter   S [name] is a 43 y.o. [phone] patient who presents to MAU today with complaint of seen at [facility] with an inappropriate rise in her HCG's and inability to see IUP on ultrasound. On 12/9 her HCG level was 1246 and repeated on 12/11 the HCG was 1475. Therefore she was advised to follow up for inappropriate rise in Hcg and pregnancy of  unknown location to r/o ectopic.  Patient reports that she is a G4 P3 2 prior vaginal deliveries and 1 C-section.  She states this was an unplanned pregnancy and she was planning on terminating.   Patient reports for the past 10 days she has had intermittent right-sided pain which she has taken Advil  or ibuprofen  at home for with resolve.  She denies any active pain at this time not requesting medication denies any vaginal bleeding    O BP 122/74 (BP Location: Right Arm)   Pulse 64   Temp 98.4 F (36.9 C)   Resp 16   Wt 72.1 kg   LMP 12/07/2023   SpO2 100%   BMI 29.26 kg/m  Physical Exam Vitals and nursing note reviewed. Exam conducted with a chaperone present.  Constitutional:      Appearance: Normal appearance.  HENT:     Head: Normocephalic.     Nose: Nose normal.     Mouth/Throat:     Mouth: Mucous membranes are moist.  Cardiovascular:     Rate and Rhythm: Normal rate.  Pulmonary:     Effort: Pulmonary effort is normal.  Abdominal:     Palpations: Abdomen is soft.     Tenderness: There is no abdominal tenderness.  Musculoskeletal:     Cervical back: Normal range of motion.  Skin:    General: Skin is warm.  Neurological:     Mental Status: She is alert and oriented to person, place, and time.  Psychiatric:        Mood and Affect: Mood normal.        Behavior: Behavior normal.     MDM R/o Ectopic   Abdominal  in early pregnacy CBC: NM HCG Quant: ABO: O Positive OB Ultrasound (ultrasound images reviewed with OB attending Dr.  Herchel - there is an irregular empty gestational sac in the uterus) Plan for viability scan in 7-10 days message sent to the office for scheduling Vaginal Swabs: Wet prep negative UA: Negative   Differential diagnosis considered for 1st trimester vaginal bleeding includes but is not limited to: ectopic pregnancy, complete spontaneous abortion, incomplete abortion, missed abortion, threatened abortion, embryonic/fetal demise, cervical insufficiency, cervical or vaginal disorder    Orders Placed This Encounter  Procedures   Wet prep, genital    Standing Status:   Standing    Number of Occurrences:   1   US  OB LESS THAN 14 WEEKS WITH OB TRANSVAGINAL    Standing Status:   Standing    Number of Occurrences:   1    Symptom/Reason for Exam:   Abdominal pain affecting pregnancy [[id number]]   CBC    Standing Status:   Standing    Number of Occurrences:   1   hCG, quantitative, pregnancy    Standing Status:   Standing    Number of Occurrences:   1   Urinalysis, Routine w reflex microscopic -Urine, Random    Standing Status:   Standing    Number of Occurrences:   1  Specimen Source:   Urine, Random [244]   ABO/Rh    Standing Status:   Standing    Number of Occurrences:   1   Discharge patient Discharge disposition: 01-Home or Self Care; Discharge patient date: 01/20/2024    Standing Status:   Standing    Number of Occurrences:   1    Discharge disposition:   01-Home or Self Care [1]    Discharge patient date:   01/20/2024      Results for orders placed or performed during the hospital encounter of 01/20/24 (from the past 24 hours)  Urinalysis, Routine w reflex microscopic -Urine, Random     Status: None   Collection Time: 01/20/24 11:01 AM  Result Value Ref Range   Color, Urine YELLOW YELLOW   APPearance CLEAR CLEAR   Specific Gravity, Urine 1.019 1.005 - 1.030   pH 6.0 5.0 - 8.0   Glucose, UA NEGATIVE NEGATIVE mg/dL   Hgb urine dipstick NEGATIVE NEGATIVE   Bilirubin Urine  NEGATIVE NEGATIVE   Ketones, ur NEGATIVE NEGATIVE mg/dL   Protein, ur NEGATIVE NEGATIVE mg/dL   Nitrite NEGATIVE NEGATIVE   Leukocytes,Ua NEGATIVE NEGATIVE  ABO/Rh     Status: None   Collection Time: 01/20/24 11:02 AM  Result Value Ref Range   ABO/RH(D) O POS    No rh immune globuloin      NOT A RH IMMUNE GLOBULIN CANDIDATE, PT RH POSITIVE Performed at [facility], [address], [place], [place] [address]   CBC     Status: None   Collection Time: 01/20/24 11:03 AM  Result Value Ref Range   WBC 7.2 4.0 - 10.5 K/uL   RBC 5.00 3.87 - 5.11 MIL/uL   Hemoglobin 14.3 12.0 - 15.0 g/dL   HCT 57.6 63.9 - 53.9 %   MCV 84.6 80.0 - 100.0 fL   MCH 28.6 26.0 - 34.0 pg   MCHC 33.8 30.0 - 36.0 g/dL   RDW 87.0 88.4 - 84.4 %   Platelets 353 150 - 400 K/uL   nRBC 0.0 0.0 - 0.2 %  Wet prep, genital     Status: None   Collection Time: 01/20/24 11:12 AM  Result Value Ref Range   Yeast Wet Prep HPF POC NONE SEEN NONE SEEN   Trich, Wet Prep NONE SEEN NONE SEEN   Clue Cells Wet Prep HPF POC NONE SEEN NONE SEEN   WBC, Wet Prep HPF POC <10 <10   Sperm NONE SEEN        I have reviewed the patient chart and performed the physical exam . I have ordered & interpreted the lab results and reviewed and interpreted the OB ultrasound images independently and discussed them with the OB attending( Dr Herchel) Medications ordered as stated below.  A/P as described below.  Counseling and education provided and patient agreeable  with plan as described below. Verbalized understanding.    ASSESSMENT Medical screening exam complete  1. Empty gestational sac with ongoing pregnancy (Primary)  2. Language interpreter needed  3. Elevated serum hCG     PLAN Future Appointments  Date Time Provider Department Center  02/12/2024  3:30 PM Bessie, [place], OT [facility]     F/U Ultrasound for variability in 7-10 days ( message sent to the office)  Discharge from MAU in stable condition  See AVS for  full description of educational information and instructions provided to the patient at time of discharge  Warning signs for worsening condition that would  warrant emergency follow-up discussed  Patient may return to MAU as needed   Littie Olam LABOR, NP 01/20/2024 12:16 PM   This chart was dictated using voice recognition software, Dragon. Despite the best efforts of this provider to proofread and correct errors, errors may still occur which can change documentation meaning.

## 2024-01-22 LAB — GC/CHLAMYDIA PROBE AMP (~~LOC~~) NOT AT ARMC
Chlamydia: NEGATIVE
Comment: NEGATIVE
Comment: NORMAL
Neisseria Gonorrhea: NEGATIVE

## 2024-01-30 ENCOUNTER — Telehealth: Payer: Self-pay | Admitting: Diagnostic Neuroimaging

## 2024-01-30 DIAGNOSIS — R200 Anesthesia of skin: Secondary | ICD-10-CM

## 2024-01-30 NOTE — Telephone Encounter (Signed)
-----   Message from [facility] B sent at 01/29/2024  1:59 PM EST ----- Regarding: FW: please advise Hey Dr. Margaret!  Just wanted to follow up and see if you've had time to review and if you're okay with putting in order for NCV/EMG?  Thank you!! ----- Message ----- From: Vince Eloisa HERO Sent: 01/23/2024   6:42 AM EST To: Eduard JONELLE Margaret, MD Subject: FW: please advise                              Hey Dr. Margaret!  Just wanted to follow up on this patient and see if you've had time to review, thank you! ----- Message ----- From: Vince Eloisa HERO Sent: 01/17/2024   8:05 AM EST To: Eduard JONELLE Margaret, MD Subject: please advise                                  Hi Dr. Margaret!  This patient has been referred back to us  from their PCP for continued R arm numbness/pain and requesting NCV/EMG. Saw you last month for same and per your note if not improving in 4-6 weeks will do EMG. Are you okay with ordering this?   Thank you

## 2024-01-30 NOTE — Telephone Encounter (Signed)
 Orders Placed This Encounter  Procedures   NCV with EMG(electromyography)    EDUARD FABIENE HANLON, MD 01/30/2024, 7:14 PM Certified in Neurology, Neurophysiology and Neuroimaging  [facility] [address] [place], [place] [address] [phone]

## 2024-02-11 NOTE — Therapy (Incomplete)
 " OUTPATIENT OCCUPATIONAL THERAPY NEURO EVALUATION  Patient Name: Colleen Avila MRN: [id number] DOB:06/13/1980, 44 y.o., female Today's Date: 02/11/2024  PCP: Desiree Quale, NP REFERRING PROVIDER: Margaret Eduard SAUNDERS, MD  END OF SESSION:   Past Medical History:  Diagnosis Date   Fibroid    History of recurrent UTIs    Past Surgical History:  Procedure Laterality Date   CESAREAN SECTION N/A 11/04/2020   Procedure: CESAREAN SECTION;  Surgeon: Alger Gong, MD;  Location: [facility] LD ORS;  Service: Obstetrics;  Laterality: N/A;   CHOLECYSTECTOMY     Patient Active Problem List   Diagnosis Date Noted   Delivery of pregnancy by cesarean section 11/04/2020   Indication for care in labor and delivery, antepartum 11/03/2020    ONSET DATE: 12/27/2023 referral date  REFERRING DIAG: R20.0 (ICD-10-CM) - Right arm numbness  Add'l note: Intermittent right arm numbness (positional; during sleep; with bent arm position)  THERAPY DIAG:  No diagnosis found.  Rationale for Evaluation and Treatment: Rehabilitation  SUBJECTIVE:   SUBJECTIVE STATEMENT: *** Pt accompanied by: {accompnied:27141}  PERTINENT HISTORY: ***  PRECAUTIONS: {Therapy precautions:24002}  WEIGHT BEARING RESTRICTIONS: {Yes ***/No:24003}  PAIN:  Are you having pain? {OPRCPAIN:27236}  FALLS: Has patient fallen in last 6 months? {fallsyesno:27318}  LIVING ENVIRONMENT: Lives with: {OPRC lives with:25569::lives with their family} Lives in: {Lives in:25570} Stairs: {opstairs:27293} Has following equipment at home: {Assistive devices:23999}  PLOF: {PLOF:24004}  PATIENT GOALS: ***  OBJECTIVE:  Note: Objective measures were completed at Evaluation unless otherwise noted.  HAND DOMINANCE: {MISC; OT HAND [phone]}  ADLs: Overall ADLs: *** Transfers/ambulation related to ADLs: Eating: *** Grooming: *** UB Dressing: *** LB Dressing: *** Toileting: *** Bathing: *** Tub Shower  transfers: *** Equipment: {equipment:25573}  IADLs: Shopping: *** Light housekeeping: *** Meal Prep: *** Community mobility: *** Medication management: *** Financial management: *** Handwriting: {OTWRITTENEXPRESSION:25361}  MOBILITY STATUS: {OTMOBILITY:25360}  POSTURE COMMENTS:  {posture:25561} Sitting balance: {sitting balance:25483}  ACTIVITY TOLERANCE: Activity tolerance: ***  FUNCTIONAL OUTCOME MEASURES: {OTFUNCTIONALMEASURES:27238}  UPPER EXTREMITY ROM:    {AROM/PROM:27142} ROM Right eval Left eval  Shoulder flexion    Shoulder abduction    Shoulder adduction    Shoulder extension    Shoulder internal rotation    Shoulder external rotation    Elbow flexion    Elbow extension    Wrist flexion    Wrist extension    Wrist ulnar deviation    Wrist radial deviation    Wrist pronation    Wrist supination    (Blank rows = not tested)  UPPER EXTREMITY MMT:     MMT Right eval Left eval  Shoulder flexion    Shoulder abduction    Shoulder adduction    Shoulder extension    Shoulder internal rotation    Shoulder external rotation    Middle trapezius    Lower trapezius    Elbow flexion    Elbow extension    Wrist flexion    Wrist extension    Wrist ulnar deviation    Wrist radial deviation    Wrist pronation    Wrist supination    (Blank rows = not tested)  HAND FUNCTION: {handfunction:27230}  COORDINATION: {otcoordination:27237}  SENSATION: {sensation:27233}  EDEMA: ***  MUSCLE TONE: {UETONE:25567}  COGNITION: Overall cognitive status: {cognition:24006}  VISION: Subjective report: *** Baseline vision: {OTBASELINEVISION:25363} Visual history: {OTVISUALHISTORY:25364}  VISION ASSESSMENT: {visionassessment:27231}  Patient has difficulty with following activities due to following visual impairments: ***  PERCEPTION: {Perception:25564}  PRAXIS: {Praxis:25565}  OBSERVATIONS: ***  TREATMENT DATE: ***         PATIENT EDUCATION: Education details: *** Person educated: {Person educated:25204} Education method: {Education Method:25205} Education comprehension: {Education Comprehension:25206}  HOME EXERCISE PROGRAM: ***   GOALS: Goals reviewed with patient? {yes/no:20286}  SHORT TERM GOALS: Target date: ***  *** Baseline: Goal status: INITIAL  2.  *** Baseline:  Goal status: INITIAL  3.  *** Baseline:  Goal status: INITIAL  4.  *** Baseline:  Goal status: INITIAL  5.  *** Baseline:  Goal status: INITIAL  6.  *** Baseline:  Goal status: INITIAL  LONG TERM GOALS: Target date: ***  *** Baseline:  Goal status: INITIAL  2.  *** Baseline:  Goal status: INITIAL  3.  *** Baseline:  Goal status: INITIAL  4.  *** Baseline:  Goal status: INITIAL  5.  *** Baseline:  Goal status: INITIAL  6.  *** Baseline:  Goal status: INITIAL  ASSESSMENT:  CLINICAL IMPRESSION: Patient is a 44 y.o. female who was seen today for occupational therapy evaluation for RUE numbness. Hx includes ***. Patient currently presents *** baseline level of functioning demonstrating functional deficits and impairments as noted below. Pt would benefit from skilled OT services in the outpatient setting to work on impairments as noted below to help pt return to PLOF as able.     PERFORMANCE DEFICITS: in functional skills including {OT physical skills:25468}, cognitive skills including {OT cognitive skills:25469}, and psychosocial skills including {OT psychosocial skills:25470}.   IMPAIRMENTS: are limiting patient from {OT performance deficits:25471}.   CO-MORBIDITIES: {Comorbidities:25485} that affects occupational performance. Patient will benefit from skilled OT to address above impairments and improve overall function.  MODIFICATION OR ASSISTANCE TO COMPLETE EVALUATION: {OT  modification:25474}  OT OCCUPATIONAL PROFILE AND HISTORY: {OT PROFILE AND HISTORY:25484}  CLINICAL DECISION MAKING: {OT CDM:25475}  REHAB POTENTIAL: {rehabpotential:25112}  EVALUATION COMPLEXITY: {Evaluation complexity:25115}    PLAN:  OT FREQUENCY: {rehab frequency:25116}  OT DURATION: {rehab duration:25117}  PLANNED INTERVENTIONS: {OT Interventions:25467}  RECOMMENDED OTHER SERVICES: ***  CONSULTED AND AGREED WITH PLAN OF CARE: {ENR:74513}  PLAN FOR NEXT SESSION: ***   Rocky Dutch, OT 02/11/2024, 8:56 PM           "

## 2024-02-12 ENCOUNTER — Ambulatory Visit: Attending: Student

## 2024-02-20 ENCOUNTER — Telehealth: Payer: Self-pay | Admitting: Family Medicine

## 2024-02-20 NOTE — Telephone Encounter (Signed)
 Called patient to inform her that she no showed her intake appointment today at 11:15. Due to her missing the appointment, her New OB on 1:20 was cancelled. Patient did not answer the call so I left a detailed message with our call back number so that she can call us  to reschedule.

## 2024-02-27 ENCOUNTER — Telehealth: Payer: Self-pay

## 2024-02-27 ENCOUNTER — Encounter: Payer: Self-pay | Admitting: Obstetrics and Gynecology

## 2024-02-27 NOTE — Telephone Encounter (Signed)
 Patient left Vmon the RN line regarding about a phone call she received at our office and is returning a phone call.    Called patient back, unable to reach patient at this time. Left VM to call our office back.   This encounter used Spanish interpreter Therisa throughout this call  Rosaline, RN

## 2024-03-12 ENCOUNTER — Telehealth: Payer: Self-pay

## 2024-03-12 ENCOUNTER — Ambulatory Visit

## 2024-03-12 NOTE — Telephone Encounter (Signed)
 VM left on nurse line in Spanish. Interpreted by CAP interpreter Therisa. Patient states she is returning phone call from 3 weeks ago. She works from 8-4:30 PM and is unable to answer phone during those times. Chart review shows front office was calling to reschedule new OB appts. Will route to front office.

## 2024-04-17 ENCOUNTER — Encounter: Admitting: Neurology
